# Patient Record
Sex: Male | Born: 1980 | Race: White | Hispanic: No | Marital: Married | Smoking: Former smoker
Health system: Southern US, Community
[De-identification: ages and names within clinical notes are randomized; demographics above are authoritative.]

## PROBLEM LIST (undated history)

## (undated) DIAGNOSIS — R569 Unspecified convulsions: Secondary | ICD-10-CM

## (undated) DIAGNOSIS — N289 Disorder of kidney and ureter, unspecified: Secondary | ICD-10-CM

## (undated) HISTORY — PX: HERNIA REPAIR: SHX51

---

## 2006-02-21 ENCOUNTER — Emergency Department (HOSPITAL_COMMUNITY): Admission: EM | Admit: 2006-02-21 | Discharge: 2006-02-21 | Payer: Self-pay | Admitting: Emergency Medicine

## 2006-02-24 ENCOUNTER — Emergency Department (HOSPITAL_COMMUNITY): Admission: EM | Admit: 2006-02-24 | Discharge: 2006-02-25 | Payer: Self-pay | Admitting: Emergency Medicine

## 2011-11-13 ENCOUNTER — Emergency Department (HOSPITAL_COMMUNITY)
Admission: EM | Admit: 2011-11-13 | Discharge: 2011-11-13 | Disposition: A | Discharge status: Home / Self Care | Payer: Self-pay | Attending: Emergency Medicine | Admitting: Emergency Medicine

## 2011-11-13 ENCOUNTER — Encounter (HOSPITAL_COMMUNITY): Payer: Self-pay | Admitting: *Deleted

## 2011-11-13 DIAGNOSIS — M542 Cervicalgia: Secondary | ICD-10-CM | POA: Insufficient documentation

## 2011-11-13 DIAGNOSIS — X503XXA Overexertion from repetitive movements, initial encounter: Secondary | ICD-10-CM | POA: Insufficient documentation

## 2011-11-13 DIAGNOSIS — Y9389 Activity, other specified: Secondary | ICD-10-CM | POA: Insufficient documentation

## 2011-11-13 DIAGNOSIS — G5600 Carpal tunnel syndrome, unspecified upper limb: Secondary | ICD-10-CM | POA: Insufficient documentation

## 2011-11-13 DIAGNOSIS — G40909 Epilepsy, unspecified, not intractable, without status epilepticus: Secondary | ICD-10-CM | POA: Insufficient documentation

## 2011-11-13 DIAGNOSIS — F17200 Nicotine dependence, unspecified, uncomplicated: Secondary | ICD-10-CM | POA: Insufficient documentation

## 2011-11-13 DIAGNOSIS — R209 Unspecified disturbances of skin sensation: Secondary | ICD-10-CM | POA: Insufficient documentation

## 2011-11-13 DIAGNOSIS — G5602 Carpal tunnel syndrome, left upper limb: Secondary | ICD-10-CM

## 2011-11-13 DIAGNOSIS — Y929 Unspecified place or not applicable: Secondary | ICD-10-CM | POA: Insufficient documentation

## 2011-11-13 HISTORY — DX: Unspecified convulsions: R56.9

## 2011-11-13 MED ORDER — HYDROCODONE-ACETAMINOPHEN 5-325 MG PO TABS: ORAL_TABLET | ORAL | Status: DC

## 2011-11-13 MED ORDER — OXYCODONE-ACETAMINOPHEN 5-325 MG PO TABS: 1.0000 | ORAL_TABLET | Freq: Once | ORAL | Status: DC

## 2011-11-13 NOTE — ED Notes (Signed)
Pt with left sided neck pain x 1 month that starts at base of head and radiates down left arm, tingling to left 4th and 5th fingers of left hand, has been seen at [facility] for same and prescribed muscle relaxers, still continues to have pain

## 2011-11-14 NOTE — ED Provider Notes (Signed)
Medical screening examination/treatment/procedure(s) were performed by non-physician practitioner and as supervising physician I was immediately available for consultation/collaboration.   Glynn Octave, MD 11/14/11 2136

## 2011-11-14 NOTE — ED Provider Notes (Signed)
History     CSN: [id number]  Arrival date & time 11/13/11  1636   First MD Initiated Contact with Patient 11/13/11 1825      Chief Complaint  Patient presents with  . Neck Pain    (Consider location/radiation/quality/duration/timing/severity/associated sxs/prior treatment) HPI Comments: Pt reports a 1 month hx of left neck pain. Most recently he has noted tingling of the left 4th and 5th fingers at the plantar area. No known hx of injury. 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 He was evaluated at [facility] and told to try muscle relaxers. Pt states this did not help. He nearly dropped a large piece pane of glass today and his boss told him to have this checked out.  Patient is a 31 y.o. male presenting with neck pain. The history is provided by the patient.  Neck Pain  The current episode started more than 1 week ago. The problem occurs daily. The problem has been gradually worsening. The pain is associated with lifting a heavy object. There has been no fever. The pain is present in the left side. The quality of the pain is described as aching. The pain radiates to the left arm and left hand. The pain is moderate. Exacerbated by: certain positions. The pain is the same all the time. Associated symptoms include numbness and tingling. Pertinent negatives include no photophobia, no chest pain, no syncope, no bowel incontinence and no bladder incontinence. He has tried muscle relaxants for the symptoms. The treatment provided no relief.    Past Medical History  Diagnosis Date  . Seizures     Past Surgical History  Procedure Date  . Hernia repair     History reviewed. No pertinent family history.  History  Substance Use Topics  . Smoking status: Current Every Day Smoker -- 1.0 packs/day    Types: Cigarettes  . Smokeless tobacco: Not on file  . Alcohol Use: Yes     Comment: occasional      Review of Systems  Constitutional: Negative for activity change.       All  ROS Neg except as noted in HPI  HENT: Positive for neck pain. Negative for nosebleeds.   Eyes: Negative for photophobia and discharge.  Respiratory: Negative for cough, shortness of breath and wheezing.   Cardiovascular: Negative for chest pain, palpitations and syncope.  Gastrointestinal: Negative for abdominal pain, blood in stool and bowel incontinence.  Genitourinary: Negative for bladder incontinence, dysuria, frequency and hematuria.  Musculoskeletal: Negative for back pain and arthralgias.  Skin: Negative.   Neurological: Positive for tingling and numbness. Negative for dizziness, seizures and speech difficulty.  Psychiatric/Behavioral: Negative for hallucinations and confusion.    Allergies  Sulfa antibiotics  Home Medications   Current Outpatient Rx  Name  Route  Sig  Dispense  Refill  . HYDROCODONE-ACETAMINOPHEN 5-325 MG PO TABS      1 at hs or q4h prn pain   15 tablet   0     BP 130/94  Pulse 93  Temp 97.7 F (36.5 C) (Oral)  Resp 20  Ht 5\' 5"  (1.651 m)  Wt 145 lb (65.772 kg)  BMI 24.13 kg/m2  SpO2 100%  Physical Exam  Nursing note and vitals reviewed. Constitutional: He is oriented to person, place, and time. He appears well-developed and well-nourished.  Non-toxic appearance.  HENT:  Head: Normocephalic.  Right Ear: Tympanic membrane and external ear normal.  Left Ear: Tympanic membrane and external ear normal.  Eyes: EOM and  lids are normal. Pupils are equal, round, and reactive to light.  Neck: Normal range of motion. Neck supple. Carotid bruit is not present.       Left neck pain to palpation. No carotid bruits appreciated. No pulsatile mass noted. Soreness of the neck with attempted range of motion.  Cardiovascular: Normal rate, regular rhythm, normal heart sounds, intact distal pulses and normal pulses.   Pulmonary/Chest: Breath sounds normal. No respiratory distress.  Abdominal: Soft. Bowel sounds are normal. There is no tenderness. There is no  guarding.  Musculoskeletal: Normal range of motion.       Is no noted muscle atrophy of the left upper extremity. The thenar eminence is symmetrical.  Lymphadenopathy:       Head (right side): No submandibular adenopathy present.       Head (left side): No submandibular adenopathy present.    He has no cervical adenopathy.  Neurological: He is alert and oriented to person, place, and time. He has normal strength. No cranial nerve deficit or sensory deficit.       Grip of the right and left hand is symmetrical. Patient states that it takes more effort for him to grip on the left hand than on the right.  Skin: Skin is warm and dry.  Psychiatric: He has a normal mood and affect. His speech is normal.    ED Course  Procedures (including critical care time)  Labs Reviewed - No data to display No results found.   1. Neck pain   2. Carpal tunnel syndrome of left wrist       MDM  I have reviewed nursing notes, vital signs, and all appropriate lab and imaging results for this patient. Patient has one month history of neck pain. Patient has numbness and tingling of the left fourth and fifth fingers they get progressively worse as the day goes on. Suspect the patient has a carpal tunnel syndrome on the left. Question if the patient may have degenerative changes of the cervical spine. Patient has a questionable Finkelstein test. Patient placed in a wrist cockup splint and advised to see orthopedics. Patient states he still has some steroid, patient advised to continue this medication. Prescription for Norco a #15 tablets also given to the patient.       Kathie Dike, [place] 11/14/11 [phone]

## 2012-09-14 ENCOUNTER — Emergency Department (HOSPITAL_COMMUNITY)
Admission: EM | Admit: 2012-09-14 | Discharge: 2012-09-14 | Disposition: A | Discharge status: Home / Self Care | Payer: Self-pay | Attending: Emergency Medicine | Admitting: Emergency Medicine

## 2012-09-14 ENCOUNTER — Emergency Department (HOSPITAL_COMMUNITY): Payer: Self-pay

## 2012-09-14 ENCOUNTER — Encounter (HOSPITAL_COMMUNITY): Payer: Self-pay | Admitting: *Deleted

## 2012-09-14 DIAGNOSIS — R109 Unspecified abdominal pain: Secondary | ICD-10-CM | POA: Insufficient documentation

## 2012-09-14 DIAGNOSIS — M545 Low back pain, unspecified: Secondary | ICD-10-CM | POA: Insufficient documentation

## 2012-09-14 DIAGNOSIS — Z87448 Personal history of other diseases of urinary system: Secondary | ICD-10-CM | POA: Insufficient documentation

## 2012-09-14 DIAGNOSIS — R112 Nausea with vomiting, unspecified: Secondary | ICD-10-CM | POA: Insufficient documentation

## 2012-09-14 DIAGNOSIS — F17200 Nicotine dependence, unspecified, uncomplicated: Secondary | ICD-10-CM | POA: Insufficient documentation

## 2012-09-14 DIAGNOSIS — R509 Fever, unspecified: Secondary | ICD-10-CM | POA: Insufficient documentation

## 2012-09-14 DIAGNOSIS — Z8669 Personal history of other diseases of the nervous system and sense organs: Secondary | ICD-10-CM | POA: Insufficient documentation

## 2012-09-14 DIAGNOSIS — Z880 Allergy status to penicillin: Secondary | ICD-10-CM | POA: Insufficient documentation

## 2012-09-14 DIAGNOSIS — R319 Hematuria, unspecified: Secondary | ICD-10-CM | POA: Insufficient documentation

## 2012-09-14 DIAGNOSIS — M549 Dorsalgia, unspecified: Secondary | ICD-10-CM

## 2012-09-14 HISTORY — DX: Disorder of kidney and ureter, unspecified: N28.9

## 2012-09-14 LAB — URINALYSIS, ROUTINE W REFLEX MICROSCOPIC
Bilirubin Urine: NEGATIVE
Hgb urine dipstick: NEGATIVE
Nitrite: NEGATIVE
Specific Gravity, Urine: >1.03 — ABNORMAL HIGH (ref 1.005–1.030)
pH: 5.5 (ref 5.0–8.0)

## 2012-09-14 LAB — BASIC METABOLIC PANEL
BUN: 8 mg/dL (ref 6–23)
Chloride: 102 mEq/L (ref 96–112)
GFR calc Af Amer: >90 mL/min (ref >90)
Glucose, Bld: 119 mg/dL — ABNORMAL HIGH (ref 70–99)
Potassium: 4.1 mEq/L (ref 3.5–5.1)

## 2012-09-14 LAB — CBC WITH DIFFERENTIAL/PLATELET
Hemoglobin: 16.4 g/dL (ref 13.0–17.0)
Lymphocytes Relative: 25 % (ref 12–46)
Lymphs Abs: 2.2 10*3/uL (ref 0.7–4.0)
Monocytes Relative: 10 % (ref 3–12)
Neutro Abs: 5.5 10*3/uL (ref 1.7–7.7)
Neutrophils Relative %: 63 % (ref 43–77)
RBC: 5.36 MIL/uL (ref 4.22–5.81)

## 2012-09-14 MED ORDER — SODIUM CHLORIDE 0.9 % IV BOLUS (SEPSIS)
1000.0000 mL | Freq: Once | INTRAVENOUS | Status: AC
  Administered 2012-09-14: 1000 mL via INTRAVENOUS

## 2012-09-14 NOTE — ED Provider Notes (Signed)
CSN: [id number]     Arrival date & time 09/14/12  1746 History  This chart was scribed for Audree Camel, MD by Henri Medal, ED Scribe. This patient was seen in room APA11/APA11 and the patient's care was started at 7:31 PM.    Chief Complaint  Patient presents with  . Back Pain    The history is provided by the patient. No language interpreter was used.   HPI Comments: Jeremy Dunn is a 32 y.o. male who presents to the Emergency Department complaining of constant 8/10 left flank pain that radiates to his lower back and LLQ abdomen onset 5 days ago. Pt was seen in the [place] ED 5 days ago and was given a CT scan and diagnosed with a lodged kidney stone and left kidney infection, that was believed to be spreading to the right kidney. He states that he has had no prior kidney stones, but states that he has a family history of kidney stones (father and brother). Pt states he was given antibiotics and Norco and took them for 2 days without relief. Pt states movement and urinating worsens his flank pain to a 10/10 severity. He reports associated emesis and hematuria for the first 2 days after the onset of pain which has subsided. He reports associated chills over the past 5 days. Pt has a history of a double hernia. Pt denies weakness, numbness, saddle anaesthesia, fever, dysuria or any other symptoms.   Past Medical History  Diagnosis Date  . Seizures   . Renal disorder     renal stones   Past Surgical History  Procedure Laterality Date  . Hernia repair     History reviewed. No pertinent family history. History  Substance Use Topics  . Smoking status: Current Every Day Smoker -- 1.00 packs/day    Types: Cigarettes  . Smokeless tobacco: Not on file  . Alcohol Use: Yes     Comment: occasional    Review of Systems  Constitutional: Positive for chills.  Gastrointestinal: Positive for nausea, vomiting (subsided) and abdominal pain.  Genitourinary: Positive for hematuria  (subsided) and flank pain.  Musculoskeletal: Positive for back pain.  All other systems reviewed and are negative.   Allergies  Amoxicillin and Sulfa antibiotics  Home Medications   Current Outpatient Rx  Name  Route  Sig  Dispense  Refill  . Aspirin-Acetaminophen-Caffeine (GOODYS EXTRA STRENGTH) 500-325-65 MG PACK   Oral   Take 1 packet by mouth every 6 (six) hours as needed. pain         . HYDROcodone-acetaminophen (NORCO/VICODIN) 5-325 MG per tablet   Oral   Take 1 tablet by mouth every 4 (four) hours as needed for pain.          Triage Vitals: BP 122/74  Pulse 97  Temp(Src) 98.5 F (36.9 C) (Oral)  Resp 17  Ht 5\' 6"  (1.676 m)  Wt 145 lb (65.772 kg)  BMI 23.41 kg/m2  SpO2 98%  Physical Exam  Nursing note and vitals reviewed. Constitutional: He is oriented to person, place, and time. He appears well-developed and well-nourished. No distress.  HENT:  Head: Normocephalic and atraumatic.  Mouth/Throat: Oropharynx is clear and moist.  Eyes: EOM are normal.  Neck: Neck supple. No tracheal deviation present.  Cardiovascular: Normal rate, regular rhythm and normal heart sounds.   Pulmonary/Chest: Effort normal and breath sounds normal. No respiratory distress.  Abdominal: Soft. Bowel sounds are normal. There is tenderness.  LLQ abdominal tenderness  Genitourinary:  Left  CVA tenderness  Musculoskeletal: Normal range of motion.       Thoracic back: He exhibits no bony tenderness.       Lumbar back: He exhibits no bony tenderness.  Neurological: He is alert and oriented to person, place, and time. He has normal strength. No sensory deficit.  5 out of 5 strength in the lower extremities bilaterally  Skin: Skin is warm and dry.  Psychiatric: He has a normal mood and affect. His behavior is normal.    ED Course  Procedures (including critical care time)  DIAGNOSTIC STUDIES: Oxygen Saturation is 98% on room air, normal by my interpretation.    COORDINATION OF  CARE: 7:40 PM-Discussed treatment plan which includes CT scan, CBC, and UA. Pt was also advised of plan to receive IV fluids. Pt understands and agrees to treatment plan.  Medications  sodium chloride 0.9 % bolus 1,000 mL (1,000 mLs Intravenous New Bag/Given 09/14/12 2009)   Labs Review Labs Reviewed  BASIC METABOLIC PANEL - Abnormal; Notable for the following:    Glucose, Bld 119 (*)    All other components within normal limits  URINALYSIS, ROUTINE W REFLEX MICROSCOPIC - Abnormal; Notable for the following:    Specific Gravity, Urine >1.030 (*)    All other components within normal limits  URINE CULTURE  CBC WITH DIFFERENTIAL   Imaging Review Ct Abdomen Pelvis Wo Contrast  09/14/2012   CLINICAL DATA:  Left flank pain.  EXAM: CT ABDOMEN AND PELVIS WITHOUT CONTRAST  TECHNIQUE: Multidetector CT imaging of the abdomen and pelvis was performed following the standard protocol without intravenous contrast.  COMPARISON:  None.  FINDINGS: Lung bases are clear. No effusions. Heart is normal size.  Liver, spleen, pancreas, adrenals and the left kidney have an unremarkable unenhanced appearance. 4 mm nonobstructing stone in the lower pole of the right kidney. No ureteral stones. No hydronephrosis. Urinary bladder is decompressed. Seminal vesicles and prostate grossly unremarkable.  Appendix is visualized and is normal. Bowel grossly unremarkable. No free fluid, free air, or adenopathy. Aorta is normal caliber.  No acute bony abnormality.  IMPRESSION: No ureteral stones or hydronephrosis. Right lower pole nephrolithiasis.   Electronically Signed   By: Charlett Nose M.D.   On: 09/14/2012 20:35    MDM   1. Back pain    Patient appears well and denies wanting any pain meds at this time, just wants to know why his back hurts. It is most c/w a MSK etiology as his sx are all exacerbated and relieved with ROM and positions. After CT and getting OSH records it is apparent he has not had any kidney stones on the  left but did have a UTI which he is almost done w/ abx for. As his kidney function is good and has benign exam I feel he is stable for outpatient f/u (already has PCP set up) and OTC treatment of pain. No midline pain or fevers to suggest more emergent back pathology. Has normal neuro exam. Discussed return precautions.  I personally performed the services described in this documentation, which was scribed in my presence. The recorded information has been reviewed and is accurate.    Audree Camel, MD 09/15/12 [phone]

## 2012-09-14 NOTE — ED Notes (Signed)
Patient asked for a drink of water. Asked nurse RN Dorann Ou, mad patient aware that his CT scans have to be resulted first.

## 2012-09-14 NOTE — Discharge Instructions (Signed)
Back Pain, Adult  Low back pain is very common. About 1 in 5 people have back pain. The cause of low back pain is rarely dangerous. The pain often gets better over time. About half of people with a sudden onset of back pain feel better in just 2 weeks. About 8 in 10 people feel better by 6 weeks.   CAUSES  Some common causes of back pain include:  · Strain of the muscles or ligaments supporting the spine.  · Wear and tear (degeneration) of the spinal discs.  · Arthritis.  · Direct injury to the back.  DIAGNOSIS  Most of the time, the direct cause of low back pain is not known. However, back pain can be treated effectively even when the exact cause of the pain is unknown. Answering your caregiver's questions about your overall health and symptoms is one of the most accurate ways to make sure the cause of your pain is not dangerous. If your caregiver needs more information, he or she may order lab work or imaging tests (X-rays or MRIs). However, even if imaging tests show changes in your back, this usually does not require surgery.  HOME CARE INSTRUCTIONS  For many people, back pain returns. Since low back pain is rarely dangerous, it is often a condition that people can learn to manage on their own.   · Remain active. It is stressful on the back to sit or stand in one place. Do not sit, drive, or stand in one place for more than 30 minutes at a time. Take short walks on level surfaces as soon as pain allows. Try to increase the length of time you walk each day.  · Do not stay in bed. Resting more than 1 or 2 days can delay your recovery.  · Do not avoid exercise or work. Your body is made to move. It is not dangerous to be active, even though your back may hurt. Your back will likely heal faster if you return to being active before your pain is gone.  · Pay attention to your body when you  bend and lift. Many people have less discomfort when lifting if they bend their knees, keep the load close to their bodies, and  avoid twisting. Often, the most comfortable positions are those that put less stress on your recovering back.  · Find a comfortable position to sleep. Use a firm mattress and lie on your side with your knees slightly bent. If you lie on your back, put a pillow under your knees.  · Only take over-the-counter or prescription medicines as directed by your caregiver. Over-the-counter medicines to reduce pain and inflammation are often the most helpful. Your caregiver may prescribe muscle relaxant drugs. These medicines help dull your pain so you can more quickly return to your normal activities and healthy exercise.  · Put ice on the injured area.  · Put ice in a plastic bag.  · Place a towel between your skin and the bag.  · Leave the ice on for 15-20 minutes, 3-4 times a day for the first 2 to 3 days. After that, ice and heat may be alternated to reduce pain and spasms.  · Ask your caregiver about trying back exercises and gentle massage. This may be of some benefit.  · Avoid feeling anxious or stressed. Stress increases muscle tension and can worsen back pain. It is important to recognize when you are anxious or stressed and learn ways to manage it. Exercise is a great option.  SEEK MEDICAL CARE IF:  · You have pain that is not relieved with rest or   medicine.  · You have pain that does not improve in 1 week.  · You have new symptoms.  · You are generally not feeling well.  SEEK IMMEDIATE MEDICAL CARE IF:   · You have pain that radiates from your back into your legs.  · You develop new bowel or bladder control problems.  · You have unusual weakness or numbness in your arms or legs.  · You develop nausea or vomiting.  · You develop abdominal pain.  · You feel faint.  Document Released: 12/19/2004 Document Revised: 06/20/2011 Document Reviewed: 05/09/2010  ExitCare® Patient Information ©2014 [employer].

## 2012-09-14 NOTE — ED Notes (Addendum)
L flank pain radiating to mid back and hip.  Was seen at [facility] last Tuesday for same, given pain medication which hasn't helped. Was scanned which showed lodged renal stone

## 2012-09-16 LAB — URINE CULTURE
Colony Count: NO GROWTH
Culture: NO GROWTH

## 2016-03-09 ENCOUNTER — Ambulatory Visit (INDEPENDENT_AMBULATORY_CARE_PROVIDER_SITE_OTHER): Payer: Medicaid Other | Admitting: Family

## 2016-03-09 ENCOUNTER — Ambulatory Visit (INDEPENDENT_AMBULATORY_CARE_PROVIDER_SITE_OTHER): Payer: Medicaid Other

## 2016-03-09 ENCOUNTER — Encounter: Payer: Self-pay | Admitting: Family

## 2016-03-09 VITALS — BP 127/83 | HR 72 | Temp 98.0°F | Ht 66.0 in | Wt 182.2 lb

## 2016-03-09 DIAGNOSIS — M79644 Pain in right finger(s): Secondary | ICD-10-CM

## 2016-03-09 DIAGNOSIS — S60041A Contusion of right ring finger without damage to nail, initial encounter: Secondary | ICD-10-CM | POA: Diagnosis not present

## 2016-03-09 MED ORDER — NAPROXEN 500 MG PO TABS: 500.0000 mg | ORAL_TABLET | Freq: Two times a day (BID) | ORAL | 0 refills | Status: DC

## 2016-03-09 NOTE — Patient Instructions (Signed)
Contusion A contusion is a deep bruise. Contusions are the result of a blunt injury to tissues and muscle fibers under the skin. The injury causes bleeding under the skin. The skin overlying the contusion may turn blue, purple, or yellow. Minor injuries will give you a painless contusion, but more severe contusions may stay painful and swollen for a few weeks. What are the causes? This condition is usually caused by a blow, trauma, or direct force to an area of the body. What are the signs or symptoms? Symptoms of this condition include:  Swelling of the injured area.  Pain and tenderness in the injured area.  Discoloration. The area may have redness and then turn blue, purple, or yellow. How is this diagnosed? This condition is diagnosed based on a physical exam and medical history. An X-ray, CT scan, or MRI may be needed to determine if there are any associated injuries, such as broken bones (fractures). How is this treated? Specific treatment for this condition depends on what area of the body was injured. In general, the best treatment for a contusion is resting, icing, applying pressure to (compression), and elevating the injured area. This is often called the RICE strategy. Over-the-counter anti-inflammatory medicines may also be recommended for pain control. Follow these instructions at home:  Rest the injured area.  If directed, apply ice to the injured area:  Put ice in a plastic bag.  Place a towel between your skin and the bag.  Leave the ice on for 20 minutes, 2-3 times per day.  If directed, apply light compression to the injured area using an elastic bandage. Make sure the bandage is not wrapped too tightly. Remove and reapply the bandage as directed by your health care provider.  If possible, raise (elevate) the injured area above the level of your heart while you are sitting or lying down.  Take over-the-counter and prescription medicines only as told by your health  care provider. Contact a health care provider if:  Your symptoms do not improve after several days of treatment.  Your symptoms get worse.  You have difficulty moving the injured area. Get help right away if:  You have severe pain.  You have numbness in a hand or foot.  Your hand or foot turns pale or cold. This information is not intended to replace advice given to you by your health care provider. Make sure you discuss any questions you have with your health care provider. Document Released: 09/28/2004 Document Revised: 04/29/2015 Document Reviewed: 05/06/2014 Elsevier Interactive Patient Education  2017 [employer] Inc.  

## 2016-03-09 NOTE — Progress Notes (Signed)
   Subjective:    Patient ID: Jeremy Dunn, male    DOB: Dec 27, 1980, 36 y.o.   MRN: [id number]  Hand Pain   The incident occurred more than 1 week ago. The injury mechanism was a direct blow. Pain location: right ring finger. The quality of the pain is described as aching. The pain does not radiate. The pain is at a severity of 8/10. The pain is moderate. The pain has been constant since the incident. The symptoms are aggravated by movement. He has tried NSAIDs and ice for the symptoms. The treatment provided mild relief.      Review of Systems  Musculoskeletal:       Right finger   All other systems reviewed and are negative.  Social History   Social History  . Marital status: Single    Spouse name: N/A  . Number of children: N/A  . Years of education: N/A   Social History Main Topics  . Smoking status: Former Smoker    Packs/day: 1.00    Types: Cigarettes  . Smokeless tobacco: Never Used  . Alcohol use Yes     Comment: occasional  . Drug use: No  . Sexual activity: Yes   Other Topics Concern  . None   Social History Narrative  . None   Family History  Problem Relation Age of Onset  . Hypertension Mother   . Cancer Father        Objective:   Physical Exam  Constitutional: He is oriented to person, place, and time. He appears well-developed and well-nourished. No distress.  HENT:  Head: Normocephalic.  Eyes: Pupils are equal, round, and reactive to light. Right eye exhibits no discharge. Left eye exhibits no discharge.  Neck: Normal range of motion. Neck supple. No thyromegaly present.  Cardiovascular: Normal rate, regular rhythm, normal heart sounds and intact distal pulses.   No murmur heard. Pulmonary/Chest: Effort normal and breath sounds normal. No respiratory distress. He has no wheezes.  Abdominal: Soft. Bowel sounds are normal. He exhibits no distension. There is no tenderness.  Musculoskeletal: Normal range of motion. He exhibits edema and  tenderness.  Decrease ROM of right ring finger with flexion related to pain   Neurological: He is alert and oriented to person, place, and time.  Skin: Skin is warm and dry. No rash noted. No erythema.  Psychiatric: He has a normal mood and affect. His behavior is normal. Judgment and thought content normal.  Vitals reviewed.   X-ray finger- No fracture noted Preliminary reading by Jannifer Rodneyhristy Hawks, FNP [facility]   BP 127/83   Pulse 72   Temp 98 F (36.7 C) (Oral)   Ht 5\' 6"  (1.676 m)   Wt 182 lb 3.2 oz (82.6 kg)   BMI 29.41 kg/m      Assessment & Plan:  1. Finger pain, right - DG Finger Ring Right; Future - naproxen (NAPROSYN) 500 MG tablet; Take 1 tablet (500 mg total) by mouth 2 (two) times daily with a meal.  Dispense: 60 tablet; Refill: 0  2. Contusion of right ring finger without damage to nail, initial encounter -Rest -Ice -Can Buddy tape if helps with pain RTO prn  - naproxen (NAPROSYN) 500 MG tablet; Take 1 tablet (500 mg total) by mouth 2 (two) times daily with a meal.  Dispense: 60 tablet; Refill: 0  Jannifer Rodneyhristy Hawks, FNP

## 2016-06-22 ENCOUNTER — Ambulatory Visit (INDEPENDENT_AMBULATORY_CARE_PROVIDER_SITE_OTHER): Payer: Medicaid Other | Admitting: Family

## 2016-06-22 ENCOUNTER — Encounter: Payer: Self-pay | Admitting: Family

## 2016-06-22 VITALS — BP 120/68 | HR 71 | Temp 97.6°F | Ht 66.0 in | Wt 183.0 lb

## 2016-06-22 DIAGNOSIS — S80262A Insect bite (nonvenomous), left knee, initial encounter: Secondary | ICD-10-CM

## 2016-06-22 DIAGNOSIS — W57XXXA Bitten or stung by nonvenomous insect and other nonvenomous arthropods, initial encounter: Secondary | ICD-10-CM

## 2016-06-22 DIAGNOSIS — R350 Frequency of micturition: Secondary | ICD-10-CM | POA: Diagnosis not present

## 2016-06-22 LAB — URINALYSIS, COMPLETE
BILIRUBIN UA: NEGATIVE
GLUCOSE, UA: NEGATIVE
KETONES UA: NEGATIVE
Leukocytes, UA: NEGATIVE
NITRITE UA: NEGATIVE
Protein, UA: NEGATIVE
RBC UA: NEGATIVE
Specific Gravity, UA: 1.01 (ref 1.005–1.030)
UUROB: 0.2 mg/dL (ref 0.2–1.0)
pH, UA: 7 (ref 5.0–7.5)

## 2016-06-22 LAB — MICROSCOPIC EXAMINATION
BACTERIA UA: NONE SEEN
Epithelial Cells (non renal): NONE SEEN /hpf (ref 0–10)
RBC MICROSCOPIC, UA: NONE SEEN /HPF (ref 0–?)
Renal Epithel, UA: NONE SEEN /hpf
WBC UA: NONE SEEN /HPF (ref 0–?)

## 2016-06-22 MED ORDER — CEPHALEXIN 500 MG PO CAPS: 500.0000 mg | ORAL_CAPSULE | Freq: Three times a day (TID) | ORAL | 0 refills | Status: DC

## 2016-06-22 NOTE — Progress Notes (Signed)
   Subjective:    Patient ID: Jeremy Dunn, male    DOB: Feb 27, 1980, 36 y.o.   MRN: [id number]  Urinary Frequency   This is a new problem. The current episode started more than 1 month ago. The problem occurs every urination. The problem has been gradually worsening. The pain is at a severity of 0/10. The patient is experiencing no pain. There has been no fever. Associated symptoms include flank pain, frequency, hesitancy and urgency. Pertinent negatives include no discharge, hematuria, nausea or vomiting. He has tried increased fluids for the symptoms.  Rash  This is a new problem. The current episode started in the past 7 days. The problem has been gradually worsening since onset. Location: posterior left knee. The rash is characterized by itchiness, dryness and redness. Pertinent negatives include no vomiting. Past treatments include anti-itch cream. The treatment provided mild relief.      Review of Systems  Gastrointestinal: Negative for nausea and vomiting.  Genitourinary: Positive for flank pain, frequency, hesitancy and urgency. Negative for hematuria.  Skin: Positive for rash.  All other systems reviewed and are negative.      Objective:   Physical Exam  Constitutional: He is oriented to person, place, and time. He appears well-developed and well-nourished. No distress.  HENT:  Head: Normocephalic.  Right Ear: External ear normal.  Left Ear: External ear normal.  Nose: Nose normal.  Mouth/Throat: Oropharynx is clear and moist.  Eyes: Pupils are equal, round, and reactive to light. Right eye exhibits no discharge. Left eye exhibits no discharge.  Neck: Normal range of motion. Neck supple. No thyromegaly present.  Cardiovascular: Normal rate, regular rhythm, normal heart sounds and intact distal pulses.   No murmur heard. Pulmonary/Chest: Effort normal and breath sounds normal. No respiratory distress. He has no wheezes.  Abdominal: Soft. Bowel sounds are normal. He  exhibits no distension. There is no tenderness.  Musculoskeletal: Normal range of motion. He exhibits no edema or tenderness.  Neurological: He is alert and oriented to person, place, and time.  Skin: Skin is warm and dry. Rash noted. There is erythema.  Erythemas circular rash on posterior left knee, warmth and tenderness present  Psychiatric: He has a normal mood and affect. His behavior is normal. Judgment and thought content normal.  Vitals reviewed.    BP 120/68   Pulse 71   Temp 97.6 F (36.4 C) (Oral)   Ht 5\' 6"  (1.676 m)   Wt 183 lb (83 kg)   BMI 29.54 kg/m      Assessment & Plan:  1. Urine frequency -Avoid caffeine and alcohol  - Urinalysis, Complete Avoid constipation  If these symptoms continue may need to try medication  2. Insect bite, initial encounter Do not scratch Report any increase in erythemas or fevers RTO if it does not improve or worsens - cephALEXin (KEFLEX) 500 MG capsule; Take 1 capsule (500 mg total) by mouth 3 (three) times daily.  Dispense: 30 capsule; Refill: 0   Jannifer Rodneyhristy Hannia Matchett, FNP

## 2016-06-22 NOTE — Patient Instructions (Addendum)

## 2016-06-23 LAB — URINE CULTURE

## 2016-06-27 ENCOUNTER — Ambulatory Visit (INDEPENDENT_AMBULATORY_CARE_PROVIDER_SITE_OTHER): Payer: Medicaid Other | Admitting: Family Medicine

## 2016-06-27 ENCOUNTER — Encounter: Payer: Self-pay | Admitting: Family Medicine

## 2016-06-27 ENCOUNTER — Ambulatory Visit: Payer: Medicaid Other

## 2016-06-27 VITALS — BP 133/81 | HR 74 | Temp 98.5°F | Ht 66.0 in | Wt 184.0 lb

## 2016-06-27 DIAGNOSIS — K2100 Gastro-esophageal reflux disease with esophagitis, without bleeding: Secondary | ICD-10-CM

## 2016-06-27 MED ORDER — RANITIDINE HCL 150 MG PO TABS: 150.0000 mg | ORAL_TABLET | Freq: Two times a day (BID) | ORAL | 5 refills | Status: DC

## 2016-06-27 NOTE — Progress Notes (Signed)
BP 133/81   Pulse 74   Temp 98.5 F (36.9 C) (Oral)   Ht 5\' 6"  (1.676 m)   Wt 184 lb (83.5 kg)   BMI 29.70 kg/m    Subjective:    Patient ID: Jeremy Dunn, male    DOB: 10/02/1980, 36 y.o.   MRN: [id number]  HPI: Jeremy Dunn is a 36 y.o. male presenting on 06/27/2016 for Pain in chest, worse when lifting or moving   HPI Chest pain Patient has been having chest pain in the center of his chest that is worse with lifting or moving things he says the pain feels like a burning sensation that is worse when he stretches his arms out. He also complains of upper abdominal discomfort and belching and burping that is all been increased since yesterday. He says it was especially worse last evening when he was lying down watching something on the television. He has not had recurrent issues with gas and reflux. He denies any fevers or chills or nausea or vomiting. He denies any blood in his stool or diarrhea or constipation. He denies any shortness of breath or lightheadedness or dizziness.  Relevant past medical, surgical, family and social history reviewed and updated as indicated. Interim medical history since our last visit reviewed. Allergies and medications reviewed and updated.  Review of Systems  Constitutional: Negative for chills and fever.  Eyes: Negative for discharge.  Respiratory: Negative for shortness of breath and wheezing.   Cardiovascular: Positive for chest pain. Negative for leg swelling.  Gastrointestinal: Positive for abdominal pain. Negative for constipation, diarrhea, nausea and vomiting.  Genitourinary: Negative for difficulty urinating.  Musculoskeletal: Negative for back pain and gait problem.  Skin: Negative for rash.  Neurological: Negative for dizziness.  All other systems reviewed and are negative.   Per HPI unless specifically indicated above        Objective:    BP 133/81   Pulse 74   Temp 98.5 F (36.9 C) (Oral)   Ht 5\' 6"  (1.676 m)   Wt  184 lb (83.5 kg)   BMI 29.70 kg/m   Wt Readings from Last 3 Encounters:  06/27/16 184 lb (83.5 kg)  06/22/16 183 lb (83 kg)  03/09/16 182 lb 3.2 oz (82.6 kg)    Physical Exam  Constitutional: He is oriented to person, place, and time. He appears well-developed and well-nourished. No distress.  Eyes: Conjunctivae are normal. No scleral icterus.  Cardiovascular: Normal rate, regular rhythm, normal heart sounds and intact distal pulses.   No murmur heard. Pulmonary/Chest: Effort normal and breath sounds normal. No respiratory distress. He has no wheezes. He exhibits tenderness (Reproducible sternal chest wall tenderness).  Abdominal: Soft. Bowel sounds are normal. He exhibits no distension. There is tenderness (Epigastric abdominal discomfort). There is no rebound and no guarding.  Musculoskeletal: Normal range of motion. He exhibits no edema.  Neurological: He is alert and oriented to person, place, and time. Coordination normal.  Skin: Skin is warm and dry. No rash noted. He is not diaphoretic.  Psychiatric: He has a normal mood and affect. His behavior is normal.  Nursing note and vitals reviewed.       Assessment & Plan:   Problem List Items Addressed This Visit    None    Visit Diagnoses    Gastroesophageal reflux disease with esophagitis    -  Primary   Likely from antibiotic that he is taking, recommended grief yogurt and Zantac   Relevant Medications  ranitidine (ZANTAC) 150 MG tablet       Follow up plan: Return if symptoms worsen or fail to improve.  Counseling provided for all of the vaccine components No orders of the defined types were placed in this encounter.   Arville Care, MD [facility] 06/27/2016, 4:53 PM

## 2016-06-28 ENCOUNTER — Ambulatory Visit: Payer: Medicaid Other

## 2016-08-10 ENCOUNTER — Ambulatory Visit (INDEPENDENT_AMBULATORY_CARE_PROVIDER_SITE_OTHER): Payer: Medicaid Other | Admitting: Pediatrics

## 2016-08-10 ENCOUNTER — Encounter: Payer: Self-pay | Admitting: Pediatrics

## 2016-08-10 ENCOUNTER — Ambulatory Visit (INDEPENDENT_AMBULATORY_CARE_PROVIDER_SITE_OTHER): Payer: Medicaid Other

## 2016-08-10 VITALS — BP 137/88 | HR 84 | Temp 97.7°F | Ht 66.0 in | Wt 183.6 lb

## 2016-08-10 DIAGNOSIS — R1032 Left lower quadrant pain: Secondary | ICD-10-CM

## 2016-08-10 DIAGNOSIS — R309 Painful micturition, unspecified: Secondary | ICD-10-CM | POA: Diagnosis not present

## 2016-08-10 DIAGNOSIS — N3090 Cystitis, unspecified without hematuria: Secondary | ICD-10-CM | POA: Diagnosis not present

## 2016-08-10 LAB — MICROSCOPIC EXAMINATION: Renal Epithel, UA: NONE SEEN /hpf

## 2016-08-10 LAB — URINALYSIS, COMPLETE
BILIRUBIN UA: NEGATIVE
Glucose, UA: NEGATIVE
KETONES UA: NEGATIVE
NITRITE UA: NEGATIVE
Protein, UA: NEGATIVE
SPEC GRAV UA: 1.02 (ref 1.005–1.030)
Urobilinogen, Ur: 0.2 mg/dL (ref 0.2–1.0)
pH, UA: 5 (ref 5.0–7.5)

## 2016-08-10 MED ORDER — CIPROFLOXACIN HCL 500 MG PO TABS: 500.0000 mg | ORAL_TABLET | Freq: Two times a day (BID) | ORAL | 0 refills | Status: AC

## 2016-08-10 NOTE — Progress Notes (Signed)
  Subjective:   Patient ID: Jeremy Dunn, male    DOB: Nov 06, 1980, 36 y.o.   MRN: [id number] CC: Back Pain (Lower, left); Abdominal Pain (lower, left); and painful urination  HPI: Jeremy Dunn is a 36 y.o. male presenting for Back Pain (Lower, left); Abdominal Pain (lower, left); and painful urination  Back has been hurting last few days Sharp sting when he urinates No fevers No penile discharge Here today with partner and 5 kids Appetite has been fine No nausea/vomiting  Has h/o kidney stones, last over a year ago  No new sexual partners  Relevant past medical, surgical, family and social history reviewed. Allergies and medications reviewed and updated. History  Smoking Status  . Former Smoker  . Packs/day: 1.00  . Types: Cigarettes  Smokeless Tobacco  . Never Used   ROS: Per HPI   Objective:    BP 137/88   Pulse 84   Temp 97.7 F (36.5 C) (Oral)   Ht 5\' 6"  (1.676 m)   Wt 183 lb 9.6 oz (83.3 kg)   BMI 29.63 kg/m   Wt Readings from Last 3 Encounters:  08/10/16 183 lb 9.6 oz (83.3 kg)  06/27/16 184 lb (83.5 kg)  06/22/16 183 lb (83 kg)    Gen: NAD, alert, cooperative with exam, NCAT EYES: EOMI, no conjunctival injection, or no icterus ENT:   OP without erythema LYMPH: no cervical LAD CV: NRRR, normal S1/S2, no murmur, distal pulses 2+ b/l Resp: CTABL, no wheezes, normal WOB Abd: +BS, soft, mildly tender with palpation lower abd, ND. no guarding or organomegaly. No CVA tenderness Ext: No edema, warm Neuro: Alert and oriented MSK: normal muscle bulk  Assessment & Plan:  [name] was seen today for back pain, abdominal pain and painful urination.  Diagnoses and all orders for this visit:  Painful urination UA +blood, LEU Will treat for cystitis, if not improving needs ct to eval stone -     Urinalysis, Complete  Left inguinal pain Normal xray -     DG Abd 1 View; Future  Cystitis -     ciprofloxacin (CIPRO) 500 MG tablet; Take 1 tablet (500 mg  total) by mouth 2 (two) times daily.  Other orders -     Microscopic Examination   Follow up plan: As needed Rex Krasarol Vincent, MD [facility]

## 2016-08-21 ENCOUNTER — Encounter: Payer: Self-pay | Admitting: Nurse Practitioner

## 2016-08-21 ENCOUNTER — Ambulatory Visit (INDEPENDENT_AMBULATORY_CARE_PROVIDER_SITE_OTHER): Payer: Medicaid Other | Admitting: Nurse Practitioner

## 2016-08-21 VITALS — BP 120/89 | HR 74 | Temp 96.9°F | Ht 66.0 in | Wt 184.0 lb

## 2016-08-21 DIAGNOSIS — K640 First degree hemorrhoids: Secondary | ICD-10-CM

## 2016-08-21 MED ORDER — HYDROCORTISONE ACETATE 25 MG RE SUPP: 25.0000 mg | Freq: Two times a day (BID) | RECTAL | 0 refills | Status: DC

## 2016-08-21 NOTE — Progress Notes (Signed)
   Subjective:    Patient ID: Jeremy Dunn, male    DOB: September 08, 1980, 36 y.o.   MRN: [id number]  HPI Patient comes in today stating that there is something sticking out of his rectum. Bleeding and hurts to sit down. He has used OTC hemorrhoid cream which as not helped.    Review of Systems  Constitutional: Negative.   HENT: Negative.   Respiratory: Negative.   Cardiovascular: Negative.   Gastrointestinal: Positive for constipation (occasional).  Genitourinary: Negative.   Neurological: Negative.   Psychiatric/Behavioral: Negative.   All other systems reviewed and are negative.      Objective:   Physical Exam  Constitutional: He is oriented to person, place, and time. He appears well-developed and well-nourished. No distress.  Cardiovascular: Normal rate and regular rhythm.   Pulmonary/Chest: Effort normal and breath sounds normal.  Genitourinary:  Genitourinary Comments: Large external thrombosed hemorrhoid- tender to touch  Neurological: He is alert and oriented to person, place, and time.  Skin: Skin is warm.  Psychiatric: He has a normal mood and affect. His behavior is normal. Judgment and thought content normal.   BP 120/89   Pulse 74   Temp (!) 96.9 F (36.1 C) (Oral)   Ht 5\' 6"  (1.676 m)   Wt 184 lb (83.5 kg)   BMI 29.70 kg/m       Assessment & Plan:   1. Grade I hemorrhoids    Meds ordered this encounter  Medications  . hydrocortisone (ANUSOL-HC) 25 MG suppository    Sig: Place 1 suppository (25 mg total) rectally 2 (two) times daily.    Dispense:  12 suppository    Refill:  0    Order Specific Question:   Supervising Provider    Answer:   VINCENT, CAROL L [[id number]]   Increase fiber in diet Stool softners daily prepration h wipes after having bowel  movement RTO prn  Mary-Margaret Daphine Deutscher, FNP

## 2016-08-21 NOTE — Patient Instructions (Signed)
Hemorrhoids    Hemorrhoids are swollen veins in and around the rectum or anus. Hemorrhoids can cause pain, itching, or bleeding. Most of the time, they do not cause serious problems. They usually get better with diet changes, lifestyle changes, and other home treatments.  Follow these instructions at home:  Eating and drinking  · Eat foods that have fiber, such as whole grains, beans, nuts, fruits, and vegetables. Ask your doctor about taking products that have added fiber (fiber supplements).  · Drink enough fluid to keep your pee (urine) clear or pale yellow.  For Pain and Swelling  · Take a warm-water bath (sitz bath) for 20 minutes to ease pain. Do this 3-4 times a day.  · If directed, put ice on the painful area. It may be helpful to use ice between your warm baths.  ¨ Put ice in a plastic bag.  ¨ Place a towel between your skin and the bag.  ¨ Leave the ice on for 20 minutes, 2-3 times a day.  General instructions  · Take over-the-counter and prescription medicines only as told by your doctor.  ¨ Medicated creams and medicines that are inserted into the anus (suppositories) may be used or applied as told.  · Exercise often.  · Go to the bathroom when you have the urge to poop (to have a bowel movement). Do not wait.  · Avoid pushing too hard (straining) when you poop.  · Keep the butt area dry and clean. Use wet toilet paper or moist paper towels.  · Do not sit on the toilet for a long time.  Contact a doctor if:  · You have any of these:  ¨ Pain and swelling that do not get better with treatment or medicine.  ¨ Bleeding that will not stop.  ¨ Trouble pooping or you cannot poop.  ¨ Pain or swelling outside the area of the hemorrhoids.  This information is not intended to replace advice given to you by your health care provider. Make sure you discuss any questions you have with your health care provider.  Document Released: 09/28/2007 Document Revised: 05/27/2015 Document Reviewed: 09/02/2014  Elsevier  Interactive Patient Education © 2018 [employer]   

## 2016-08-25 ENCOUNTER — Ambulatory Visit: Payer: Medicaid Other | Admitting: Family

## 2016-10-27 DIAGNOSIS — H5310 Unspecified subjective visual disturbances: Secondary | ICD-10-CM | POA: Diagnosis not present

## 2016-10-27 DIAGNOSIS — H31091 Other chorioretinal scars, right eye: Secondary | ICD-10-CM | POA: Diagnosis not present

## 2016-10-31 ENCOUNTER — Encounter: Payer: Self-pay | Admitting: Family

## 2016-10-31 ENCOUNTER — Ambulatory Visit (INDEPENDENT_AMBULATORY_CARE_PROVIDER_SITE_OTHER): Payer: Medicaid Other | Admitting: Family

## 2016-10-31 VITALS — BP 140/87 | HR 83 | Temp 97.2°F | Ht 66.0 in | Wt 181.2 lb

## 2016-10-31 DIAGNOSIS — H81399 Other peripheral vertigo, unspecified ear: Secondary | ICD-10-CM | POA: Diagnosis not present

## 2016-10-31 DIAGNOSIS — M5030 Other cervical disc degeneration, unspecified cervical region: Secondary | ICD-10-CM | POA: Diagnosis not present

## 2016-10-31 DIAGNOSIS — H539 Unspecified visual disturbance: Secondary | ICD-10-CM | POA: Diagnosis not present

## 2016-10-31 DIAGNOSIS — R42 Dizziness and giddiness: Secondary | ICD-10-CM | POA: Diagnosis not present

## 2016-10-31 DIAGNOSIS — R51 Headache: Secondary | ICD-10-CM

## 2016-10-31 DIAGNOSIS — G8929 Other chronic pain: Secondary | ICD-10-CM

## 2016-10-31 NOTE — Progress Notes (Signed)
   Subjective:    Patient ID: Jeremy Dunn, male    DOB: 11/04/1980, 36 y.o.   MRN: [id number]  HPI PT presents to the office because he is seeing "flashing and dots" for the last week. PT states the flashing is constant and the "dots come and go". Pt has had his eye examined and was told he needed new glasses, but that his eyes were healthy.  PT denies any nausea, but has intermittent headaches and dizziness that started two years ago. Denies any changes in speech or gait.  Pt states his headaches and dizziness started the same time he was diagnosed with cervical degenerative disc. Pt reports constant aching pain in his neck of 8 out 10.   Review of Systems  All other systems reviewed and are negative.      Objective:   Physical Exam  Constitutional: He is oriented to person, place, and time. He appears well-developed and well-nourished. No distress.  HENT:  Head: Normocephalic.  Right Ear: External ear normal.  Left Ear: External ear normal.  Nose: Nose normal.  Mouth/Throat: Oropharynx is clear and moist.  Eyes: Pupils are equal, round, and reactive to light. Right eye exhibits no discharge. Left eye exhibits no discharge.  Neck: Normal range of motion. Neck supple. No thyromegaly present.  Cardiovascular: Normal rate, regular rhythm, normal heart sounds and intact distal pulses.   No murmur heard. Pulmonary/Chest: Effort normal and breath sounds normal. No respiratory distress. He has no wheezes.  Abdominal: Soft. Bowel sounds are normal. He exhibits no distension. There is no tenderness.  Musculoskeletal: Normal range of motion. He exhibits no edema or tenderness.  Neurological: He is alert and oriented to person, place, and time.  Decrease peripheral vision, tandem gait positive, negative romberg test  Skin: Skin is warm and dry. No rash noted. No erythema.  Psychiatric: He has a normal mood and affect. His behavior is normal. Judgment and thought content normal.  Vitals  reviewed.     BP 140/87   Pulse 83   Temp (!) 97.2 F (36.2 C) (Oral)   Ht _0  (1.676 m)   Wt 181 lb 3.2 oz (82.2 kg)   BMI 29.25 kg/m      Assessment & Plan:  1. Chronic nonintractable headache, unspecified headache type - CMP14+EGFR - CT Head Wo Contrast; Future - CBC with Differential/Platelet  2. Dizziness - CMP14+EGFR - CT Head Wo Contrast; Future - CBC with Differential/Platelet  3. Visual changes  - CMP14+EGFR - CT Head Wo Contrast; Future - CBC with Differential/Platelet  4. Other peripheral vertigo, unspecified ear - CT Head Wo Contrast; Future - CBC with Differential/Platelet  5. Degenerative cervical disc - CBC with Differential/Platelet  Will order CT scan of head to rule out lesion or mass If CT negative and symptoms continue may warrant neurologists referral  Falls precautions discussed Labs pending  RTO in 1 month or depending on labs and scan If symptoms worsen with greater changes in gait, vision, or speech go to ED   [name], FNP

## 2016-11-01 LAB — CBC WITH DIFFERENTIAL/PLATELET
BASOS: 1 %
Basophils Absolute: 0.1 10*3/uL (ref 0.0–0.2)
EOS (ABSOLUTE): 0.2 10*3/uL (ref 0.0–0.4)
EOS: 3 %
HEMATOCRIT: 44.4 % (ref 37.5–51.0)
Hemoglobin: 15 g/dL (ref 13.0–17.7)
IMMATURE GRANS (ABS): 0 10*3/uL (ref 0.0–0.1)
Immature Granulocytes: 0 %
Lymphocytes Absolute: 1.5 10*3/uL (ref 0.7–3.1)
Lymphs: 30 %
MCH: 29.2 pg (ref 26.6–33.0)
MCHC: 33.8 g/dL (ref 31.5–35.7)
MCV: 86 fL (ref 79–97)
Monocytes Absolute: 0.5 10*3/uL (ref 0.1–0.9)
Monocytes: 10 %
NEUTROS ABS: 2.8 10*3/uL (ref 1.4–7.0)
Neutrophils: 56 %
PLATELETS: 269 10*3/uL (ref 150–379)
RBC: 5.14 x10E6/uL (ref 4.14–5.80)
RDW: 14.1 % (ref 12.3–15.4)
WBC: 4.9 10*3/uL (ref 3.4–10.8)

## 2016-11-01 LAB — CMP14+EGFR
A/G RATIO: 2.2 (ref 1.2–2.2)
ALT: 21 IU/L (ref 0–44)
AST: 17 IU/L (ref 0–40)
Albumin: 4.7 g/dL (ref 3.5–5.5)
Alkaline Phosphatase: 110 IU/L (ref 39–117)
BUN/Creatinine Ratio: 8 — ABNORMAL LOW (ref 9–20)
BUN: 7 mg/dL (ref 6–20)
Bilirubin Total: 0.3 mg/dL (ref 0.0–1.2)
CALCIUM: 9.6 mg/dL (ref 8.7–10.2)
CO2: 25 mmol/L (ref 20–29)
CREATININE: 0.89 mg/dL (ref 0.76–1.27)
Chloride: 101 mmol/L (ref 96–106)
GFR, EST AFRICAN AMERICAN: 128 mL/min/{1.73_m2} (ref >59)
GFR, EST NON AFRICAN AMERICAN: 111 mL/min/{1.73_m2} (ref >59)
GLOBULIN, TOTAL: 2.1 g/dL (ref 1.5–4.5)
Glucose: 106 mg/dL — ABNORMAL HIGH (ref 65–99)
POTASSIUM: 4 mmol/L (ref 3.5–5.2)
SODIUM: 144 mmol/L (ref 134–144)
TOTAL PROTEIN: 6.8 g/dL (ref 6.0–8.5)

## 2016-11-08 ENCOUNTER — Ambulatory Visit (HOSPITAL_COMMUNITY): Payer: Self-pay

## 2016-11-10 ENCOUNTER — Ambulatory Visit (INDEPENDENT_AMBULATORY_CARE_PROVIDER_SITE_OTHER): Payer: Medicaid Other | Admitting: Family Medicine

## 2016-11-10 ENCOUNTER — Encounter: Payer: Self-pay | Admitting: Family Medicine

## 2016-11-10 DIAGNOSIS — I10 Essential (primary) hypertension: Secondary | ICD-10-CM

## 2016-11-10 MED ORDER — AMLODIPINE BESYLATE 2.5 MG PO TABS: 2.5000 mg | ORAL_TABLET | Freq: Every day | ORAL | 3 refills | Status: DC

## 2016-11-10 NOTE — Progress Notes (Signed)
BP 136/85   Pulse 87   Temp (!) 97.4 F (36.3 C) (Oral)   Ht 5\' 6"  (1.676 m)   Wt 177 lb (80.3 kg)   BMI 28.57 kg/m    Subjective:    Patient ID: Jeremy Dunn, male    DOB: November 25, 1980, 36 y.o.   MRN: [id number]  HPI: Jeremy Dunn is a 36 y.o. male presenting on 11/10/2016 for Hypertension (x 2 weeks, 148/107 earlier this morning; been running between 120-150/over 80-100)   HPI Hypertension Patient is currently on no medication but has been having elevated blood pressures at home that have been consistently in the 140s over 90s, and their blood pressure today is 136/85. Patient denies any lightheadedness or dizziness. Patient denies headaches, blurred vision, chest pains, shortness of breath, or weakness. Denies any side effects from medication and is content with current medication.  Patient has extensive family history of blood pressure issues and would like to try at least a low-dose of something for prevention  Relevant past medical, surgical, family and social history reviewed and updated as indicated. Interim medical history since our last visit reviewed. Allergies and medications reviewed and updated.  Review of Systems  Constitutional: Negative for chills and fever.  Eyes: Negative for discharge.  Respiratory: Negative for shortness of breath and wheezing.   Cardiovascular: Negative for chest pain and leg swelling.  Musculoskeletal: Negative for back pain and gait problem.  Skin: Negative for rash.  Neurological: Positive for headaches. Negative for dizziness, weakness, light-headedness and numbness.  All other systems reviewed and are negative.   Per HPI unless specifically indicated above        Objective:    BP 136/85   Pulse 87   Temp (!) 97.4 F (36.3 C) (Oral)   Ht 5\' 6"  (1.676 m)   Wt 177 lb (80.3 kg)   BMI 28.57 kg/m   Wt Readings from Last 3 Encounters:  11/10/16 177 lb (80.3 kg)  10/31/16 181 lb 3.2 oz (82.2 kg)  08/21/16 184 lb (83.5 kg)     Physical Exam  Constitutional: He is oriented to person, place, and time. He appears well-developed and well-nourished. No distress.  Eyes: Conjunctivae are normal. No scleral icterus.  Neck: Neck supple. No thyromegaly present.  Cardiovascular: Normal rate, regular rhythm, normal heart sounds and intact distal pulses.  No murmur heard. Pulmonary/Chest: Effort normal and breath sounds normal. No respiratory distress. He has no wheezes.  Musculoskeletal: Normal range of motion. He exhibits no edema.  Lymphadenopathy:    He has no cervical adenopathy.  Neurological: He is alert and oriented to person, place, and time. Coordination normal.  Skin: Skin is warm and dry. No rash noted. He is not diaphoretic.  Psychiatric: He has a normal mood and affect. His behavior is normal.  Nursing note and vitals reviewed.     Assessment & Plan:   Problem List Items Addressed This Visit      Cardiovascular and Mediastinum   Benign essential hypertension   Relevant Medications   amLODipine (NORVASC) 2.5 MG tablet   Other Relevant Orders   DME Other see comment       Follow up plan: Return in about 4 weeks (around 12/08/2016), or if symptoms worsen or fail to improve, for Hypertension recheck.  Counseling provided for all of the vaccine components No orders of the defined types were placed in this encounter.   Arville CareJoshua Dettinger, MD [facility] 11/10/2016, 12:37 PM

## 2016-11-14 ENCOUNTER — Ambulatory Visit: Payer: Medicaid Other | Admitting: Family

## 2016-11-15 ENCOUNTER — Ambulatory Visit (HOSPITAL_COMMUNITY)
Admission: RE | Admit: 2016-11-15 | Discharge: 2016-11-15 | Disposition: A | Discharge status: Home / Self Care | Payer: Medicaid Other | Source: Ambulatory Visit | Attending: Family | Admitting: Family

## 2016-11-15 DIAGNOSIS — H539 Unspecified visual disturbance: Secondary | ICD-10-CM

## 2016-11-15 DIAGNOSIS — G8929 Other chronic pain: Secondary | ICD-10-CM

## 2016-11-15 DIAGNOSIS — R51 Headache: Secondary | ICD-10-CM | POA: Insufficient documentation

## 2016-11-15 DIAGNOSIS — R42 Dizziness and giddiness: Secondary | ICD-10-CM | POA: Insufficient documentation

## 2016-11-15 DIAGNOSIS — H81399 Other peripheral vertigo, unspecified ear: Secondary | ICD-10-CM

## 2016-12-11 ENCOUNTER — Ambulatory Visit: Payer: Medicaid Other | Admitting: Family Medicine

## 2016-12-20 ENCOUNTER — Encounter: Payer: Self-pay | Admitting: Family Medicine

## 2016-12-20 ENCOUNTER — Ambulatory Visit: Payer: Medicaid Other | Admitting: Family Medicine

## 2016-12-20 VITALS — BP 134/87 | HR 91 | Temp 97.4°F | Ht 66.0 in | Wt 174.0 lb

## 2016-12-20 DIAGNOSIS — I10 Essential (primary) hypertension: Secondary | ICD-10-CM | POA: Diagnosis not present

## 2016-12-20 DIAGNOSIS — Z23 Encounter for immunization: Secondary | ICD-10-CM | POA: Diagnosis not present

## 2016-12-20 DIAGNOSIS — M5412 Radiculopathy, cervical region: Secondary | ICD-10-CM

## 2016-12-20 MED ORDER — AMLODIPINE BESYLATE 5 MG PO TABS: 5.0000 mg | ORAL_TABLET | Freq: Every day | ORAL | 3 refills | Status: DC

## 2016-12-20 MED ORDER — PREDNISONE 20 MG PO TABS: ORAL_TABLET | ORAL | 0 refills | Status: DC

## 2016-12-20 NOTE — Progress Notes (Signed)
BP (!) 142/90   Pulse 91   Temp (!) 97.4 F (36.3 C) (Oral)   Ht 5\' 6"  (1.676 m)   Wt 174 lb (78.9 kg)   BMI 28.08 kg/m    Subjective:    Patient ID: Jeremy Dunn, male    DOB: 01-24-1980, 36 y.o.   MRN: [id number]  HPI: Jeremy Dunn is a 36 y.o. male presenting on 12/20/2016 for Hypertension (recheck)   HPI Hypertension Patient is currently on amlodipine 2.5, and their blood pressure today is 142/90, he says is been running up as well at home sometimes and then sometimes it will be okay. Patient denies any lightheadedness or dizziness. Patient denies headaches, blurred vision, chest pains, shortness of breath, or weakness. Denies any side effects from medication and is content with current medication.   Neck pain with numbness in fingers Patient says over the past month he has been developing worsening neck pain that is causing him to have headaches.  He had a CT of the head which showed no issues in the brain itself.  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  He says that he has numbness in the fourth and fifth digits on his right hand but has recently worsened and developed some numbness in the fourth and fifth digits on his left hand as well.  He says the pain in his neck is about a 6 or 7 out of 10 and does not radiate anywhere else but the numbness does not go all the way down his arm to those 2 fingers.  He has not seen a neurology or orthopedic surgeon for this.  He says the pain will shoot more when he looks up but does slightly improve when he looks down.  Relevant past medical, surgical, family and social history reviewed and updated as indicated. Interim medical history since our last visit reviewed. Allergies and medications reviewed and updated.  Review of Systems  Constitutional: Negative for chills and fever.  Eyes: Negative for discharge.  Respiratory: Negative for shortness of breath and wheezing.     Cardiovascular: Negative for chest pain and leg swelling.  Musculoskeletal: Positive for back pain and neck pain. Negative for gait problem.  Skin: Negative for rash.  Neurological: Positive for numbness and headaches. Negative for dizziness, weakness and light-headedness.  All other systems reviewed and are negative.   Per HPI unless specifically indicated above        Objective:    BP (!) 142/90   Pulse 91   Temp (!) 97.4 F (36.3 C) (Oral)   Ht 5\' 6"  (1.676 m)   Wt 174 lb (78.9 kg)   BMI 28.08 kg/m   Wt Readings from Last 3 Encounters:  12/20/16 174 lb (78.9 kg)  11/10/16 177 lb (80.3 kg)  10/31/16 181 lb 3.2 oz (82.2 kg)    Physical Exam  Constitutional: He is oriented to person, place, and time. He appears well-developed and well-nourished. No distress.  Eyes: Conjunctivae are normal. No scleral icterus.  Cardiovascular: Normal rate, regular rhythm, normal heart sounds and intact distal pulses.  No murmur heard. Pulmonary/Chest: Effort normal and breath sounds normal. No respiratory distress. He has no wheezes. He has no rales.  Musculoskeletal: Normal range of motion. He exhibits no edema.       Cervical back: He exhibits tenderness and bony tenderness. He exhibits normal range of motion, no swelling, no deformity and normal pulse.  Back:  Neurological: He is alert and oriented to person, place, and time. Coordination normal.  Skin: Skin is warm and dry. No rash noted. He is not diaphoretic.  Psychiatric: He has a normal mood and affect. His behavior is normal.  Nursing note and vitals reviewed.       Assessment & Plan:   Problem List Items Addressed This Visit      Cardiovascular and Mediastinum   Benign essential hypertension - Primary   Relevant Medications   amLODipine (NORVASC) 5 MG tablet    Other Visit Diagnoses    Cervical radiculopathy       Relevant Medications   predniSONE (DELTASONE) 20 MG tablet   Other Relevant Orders   Ambulatory  referral to Orthopedic Surgery       Follow up plan: Return in about 3 months (around 03/20/2017), or if symptoms worsen or fail to improve, for Hypertension recheck.  Counseling provided for all of the vaccine components Orders Placed This Encounter  Procedures  . Ambulatory referral to Orthopedic Surgery    Arville CareJoshua Om Lizotte, MD [facility] 12/20/2016, 1:20 PM

## 2017-01-19 ENCOUNTER — Ambulatory Visit: Payer: Medicaid Other | Admitting: Family Medicine

## 2017-01-23 ENCOUNTER — Encounter: Payer: Self-pay | Admitting: Family

## 2017-05-01 ENCOUNTER — Ambulatory Visit: Payer: Medicaid Other | Admitting: Family

## 2017-05-01 ENCOUNTER — Encounter: Payer: Self-pay | Admitting: Family

## 2017-05-01 VITALS — BP 126/87 | HR 83 | Temp 98.9°F | Ht 66.0 in | Wt 174.0 lb

## 2017-05-01 DIAGNOSIS — Z0000 Encounter for general adult medical examination without abnormal findings: Secondary | ICD-10-CM

## 2017-05-01 DIAGNOSIS — F411 Generalized anxiety disorder: Secondary | ICD-10-CM | POA: Insufficient documentation

## 2017-05-01 DIAGNOSIS — F4010 Social phobia, unspecified: Secondary | ICD-10-CM | POA: Insufficient documentation

## 2017-05-01 DIAGNOSIS — F1011 Alcohol abuse, in remission: Secondary | ICD-10-CM | POA: Insufficient documentation

## 2017-05-01 DIAGNOSIS — F3181 Bipolar II disorder: Secondary | ICD-10-CM

## 2017-05-01 DIAGNOSIS — I10 Essential (primary) hypertension: Secondary | ICD-10-CM

## 2017-05-01 DIAGNOSIS — F6381 Intermittent explosive disorder: Secondary | ICD-10-CM | POA: Insufficient documentation

## 2017-05-01 DIAGNOSIS — R350 Frequency of micturition: Secondary | ICD-10-CM

## 2017-05-01 LAB — URINALYSIS, COMPLETE
BILIRUBIN UA: NEGATIVE
GLUCOSE, UA: NEGATIVE
KETONES UA: NEGATIVE
NITRITE UA: NEGATIVE
Protein, UA: NEGATIVE
RBC, UA: NEGATIVE
SPEC GRAV UA: 1.01 (ref 1.005–1.030)
Urobilinogen, Ur: 0.2 mg/dL (ref 0.2–1.0)
pH, UA: 5.5 (ref 5.0–7.5)

## 2017-05-01 LAB — MICROSCOPIC EXAMINATION: RBC, UA: NONE SEEN /hpf (ref 0–2)

## 2017-05-01 NOTE — Progress Notes (Signed)
   Subjective:    Patient ID: Jeremy Dunn, male    DOB: Sep 15, 1980, 37 y.o.   MRN: [id number]  Pt presents to the office today for CPE. Pt states he was followed by [facility], but was dismissed last month. He has GAD, intermittent explosive disorder, Bipolar 2,  Alcohol abuse, remission, and social phobia. Requesting new referral today. States he has two refills on his medications at this time.  Hypertension  This is a chronic problem. The problem has been resolved since onset. Associated symptoms include malaise/fatigue. Pertinent negatives include no headaches, peripheral edema or shortness of breath. Risk factors for coronary artery disease include obesity and family history. There is no history of kidney disease, CAD/MI, CVA or heart failure.  Urinary Frequency   This is a new problem. The current episode started more than 1 month ago. Associated symptoms include frequency.      Review of Systems  Constitutional: Positive for malaise/fatigue.  Respiratory: Negative for shortness of breath.   Genitourinary: Positive for frequency.  Neurological: Negative for headaches.  All other systems reviewed and are negative.      Objective:   Physical Exam  Constitutional: He is oriented to person, place, and time. He appears well-developed and well-nourished. No distress.  HENT:  Head: Normocephalic.  Right Ear: External ear normal.  Left Ear: External ear normal.  Nose: Nose normal.  Mouth/Throat: Oropharynx is clear and moist.  Eyes: Pupils are equal, round, and reactive to light. Right eye exhibits no discharge. Left eye exhibits no discharge.  Neck: Normal range of motion. Neck supple. No thyromegaly present.  Cardiovascular: Normal rate, regular rhythm, normal heart sounds and intact distal pulses.  No murmur heard. Pulmonary/Chest: Effort normal and breath sounds normal. No respiratory distress. He has no wheezes.  Abdominal: Soft. Bowel sounds are  normal. He exhibits no distension. There is no tenderness.  Musculoskeletal: Normal range of motion. He exhibits no edema or tenderness.  Neurological: He is alert and oriented to person, place, and time. He has normal reflexes.  Skin: Skin is warm and dry. No rash noted. No erythema.  Psychiatric: He has a normal mood and affect. His behavior is normal. Judgment and thought content normal.  Vitals reviewed.     BP 126/87   Pulse 83   Temp 98.9 F (37.2 C) (Oral)   Ht _0  (1.676 m)   Wt 174 lb (78.9 kg)   BMI 28.08 kg/m      Assessment & Plan:  1. Benign essential hypertension - BMP8+EGFR - CBC with Differential/Platelet  2. GAD (generalized anxiety disorder) - BMP8+EGFR - CBC with Differential/Platelet - Ambulatory referral to Psychiatry  3. Bipolar 2 disorder (HCC) - BMP8+EGFR - CBC with Differential/Platelet - Ambulatory referral to Psychiatry  4. Social phobia - BMP8+EGFR - CBC with Differential/Platelet - Ambulatory referral to Psychiatry  5. Intermittent explosive disorder - BMP8+EGFR - CBC with Differential/Platelet - Ambulatory referral to Psychiatry  6. Alcohol abuse, in remission - BMP8+EGFR - CBC with Differential/Platelet - Hepatic function panel - Ambulatory referral to Psychiatry  7. Annual physical exam - BMP8+EGFR - CBC with Differential/Platelet - Lipid panel - Hepatic function panel - TSH   8. Urinary frequency - Urinalysis, Complete    Continue all meds Labs pending Health Maintenance reviewed Diet and exercise encouraged RTO 6 months   [name], FNP

## 2017-05-01 NOTE — Patient Instructions (Signed)
Liver Function Tests  Liver function tests are blood tests to see how well your liver is working. The proteins and enzymes measured in the test can alert your health care provider to inflammation, damage, or disease in your liver. It is common to have liver function tests:   During annual physical exams.   When you are taking certain medicines.   If you have liver disease.   If you drink a lot of alcohol.   When you are not feeling well.   When you have other conditions that may affect the liver.    Substances measured may include:   Alanine transaminase (ALT). This is an enzyme in the liver.   Aspartate transaminase (AST). This is an enzyme in the liver, heart, and muscles.   Alkaline phosphatase (ALP). This is a protein in the liver, bile ducts, and bone. It is also in other body tissues.   Total bilirubin. This is a yellow pigment in bile.   Albumin. This is a protein in the liver.   Prothrombin time and international normalized ratio (PT and INR). PT measures the time that it takes for your blood to clot. INR is a calculation of blood clotting time based upon your PT result. It is also calculated based on normal ranges defined by the laboratory that processed your lab test.   Total protein. This measures two proteins, albumin and globulin, found in the blood.    How do I prepare for this test?  How you prepare will depend on which tests are being done and the reason why these tests are being done. You may need to:   Avoid eating for 4-6 hours before the test or as directed by your health care provider.   Stop taking certain medicines prior to your blood test as directed by your health care provider.    What do the results mean?  It is your responsibility to obtain your test results. Ask the lab or department performing the test when and how you will get your results. Contact your health care provider to discuss any questions you have about your results.  RANGE OF NORMAL VALUES  Ranges for normal  values may vary among different labs and hospitals. You should always check with your health care provider after having lab work or other tests done to discuss the meaning of your test results and whether your values are considered within normal limits.  The following are normal ranges for substances measured in liver function tests:  ALT   Infant: may be twice as high as adult values.   Child or adult: 4-36 international units/L at 37C or 4-36 units/L (SI units).   Elderly: may be slightly higher than adult values.  AST   Newborn 0-5 days old: 35-140 units/L.   Child under 3 years old: 15-60 units/L.   3-6 years old: 15-50 units/L.   6-12 years old: 10-50 units/L.   12-18 years old: 10-40 units/L.   Adult: 0-35 units/L or 0-0.58 microkatal/L (SI units).   Elderly: slightly higher than adults.  ALP   Child under 2 years old: 85-235 units/L.   2-8 years old: 65-210 units/L.   9-15 years old: 60-300 units/L.   16-21 years old: 30-200 units/L.   Adult: 30-120 units/L or 0.5-2.0 microkatal/L (SI units).   Elderly: slightly higher than adult.  Total bilirubin   Newborn: 1.0-12.0 mg/dL or 17.1-205 micromoles/L (SI units).   Adult, elderly, or child: 0.3-1.0 mg/dL or 5.1-17 micromoles/L.  Albumin     Premature infant: 3.0-4.2 g/dL.   Newborn: 3.5-5.4 g/dL.   Infant: 4.4-5.4 g/dL.   Child: 4.0-5.9 g/dL.   Adult or elderly: 3.5-5.0 g/dL or 35-50 g/L (SI units).  PT   11.0-12.5 seconds; 85%-100%.  INR   0.8-1.1.  Total protein   Premature infant: 4.2-7.6 g/dL.   Newborn: 4.6-7.4 g/dL.   Infant: 6.0-6.7 g/dL.   Child: 6.2-8.0 g/dL.   Adult or elderly: 6.4-8.3 g/dL or 64-83 g/L (SI units).  MEANING OF RESULTS OUTSIDE NORMAL VALUE RANGES  Sometimes test results can be abnormal due to other factors, such as medicines, exercise, or pregnancy. Follow up with your health care provider if you have any questions about test results outside the normal value ranges.  ALT   Levels above the normal range,  along with other test results, may indicate liver disease.  AST   Levels above the normal range, along with other test results, may indicate liver disease. Sometimes levels also increase after burns, surgery, heart attack, muscle damage, or seizure.  ALP   Levels above the normal range, along with other test results, may indicate biliary obstruction, diseases of the liver, bone disease, thyroid disease, tumors, fractures, leukemia or lymphoma, or several other conditions. People with blood type O or B may show higher levels after a fatty meal.   Levels below the normal range, along with other test results, may indicate bone and teeth conditions, malnutrition, protein deficiency, or Wilson disease.  Total bilirubin   Levels above the normal range, along with other test results, may indicate problems with the liver, gallbladder, or bile ducts.  Albumin   Levels above the normal range, along with other test results, may indicate dehydration. They may also be caused by a diet that is high in protein. Sometimes, the band placed around the upper arm during the process of drawing blood can cause the level of this protein in your blood to rise and give you a result above the normal range.   Levels below the normal range, along with other tests results, may indicate kidney disease, liver disease, or malabsorption of nutrients.  PT and INR   Levels above the normal range mean your blood is clotting slower than normal. This may be due to blood disorders, liver disorders, or low levels of vitamin K.  Total protein   Levels above the normal range, along with other test results, may be due to infection or other diseases.   Levels below the normal range, along with other test results, may be due to an immune system disorder, bleeding, burns, kidney disorder, liver disease, trouble absorbing or getting enough nutrients, or other conditions that affect the intestines.  Talk with your health care provider to discuss your  results, treatment options, and if necessary, the need for more tests. Talk with your health care provider if you have any questions about your results.  This information is not intended to replace advice given to you by your health care provider. Make sure you discuss any questions you have with your health care provider.  Document Released: 01/22/2004 Document Revised: 08/25/2015 Document Reviewed: 04/24/2013  Elsevier Interactive Patient Education  2018 [employer]

## 2017-05-02 LAB — HEPATIC FUNCTION PANEL
ALBUMIN: 5 g/dL (ref 3.5–5.5)
ALT: 31 IU/L (ref 0–44)
AST: 21 IU/L (ref 0–40)
Alkaline Phosphatase: 108 IU/L (ref 39–117)
BILIRUBIN, DIRECT: 0.15 mg/dL (ref 0.00–0.40)
Bilirubin Total: 0.6 mg/dL (ref 0.0–1.2)
Total Protein: 7.3 g/dL (ref 6.0–8.5)

## 2017-05-02 LAB — BMP8+EGFR
BUN/Creatinine Ratio: 8 — ABNORMAL LOW (ref 9–20)
BUN: 7 mg/dL (ref 6–20)
CO2: 27 mmol/L (ref 20–29)
CREATININE: 0.86 mg/dL (ref 0.76–1.27)
Calcium: 9.4 mg/dL (ref 8.7–10.2)
Chloride: 100 mmol/L (ref 96–106)
GFR, EST AFRICAN AMERICAN: 129 mL/min/{1.73_m2} (ref >59)
GFR, EST NON AFRICAN AMERICAN: 112 mL/min/{1.73_m2} (ref >59)
Glucose: 73 mg/dL (ref 65–99)
POTASSIUM: 4.2 mmol/L (ref 3.5–5.2)
SODIUM: 143 mmol/L (ref 134–144)

## 2017-05-02 LAB — CBC WITH DIFFERENTIAL/PLATELET
BASOS: 1 %
Basophils Absolute: 0 10*3/uL (ref 0.0–0.2)
EOS (ABSOLUTE): 0.2 10*3/uL (ref 0.0–0.4)
EOS: 2 %
HEMATOCRIT: 45.1 % (ref 37.5–51.0)
Hemoglobin: 15.5 g/dL (ref 13.0–17.7)
IMMATURE GRANULOCYTES: 0 %
Immature Grans (Abs): 0 10*3/uL (ref 0.0–0.1)
LYMPHS ABS: 2.2 10*3/uL (ref 0.7–3.1)
Lymphs: 30 %
MCH: 29.1 pg (ref 26.6–33.0)
MCHC: 34.4 g/dL (ref 31.5–35.7)
MCV: 85 fL (ref 79–97)
MONOS ABS: 0.6 10*3/uL (ref 0.1–0.9)
Monocytes: 9 %
NEUTROS ABS: 4.2 10*3/uL (ref 1.4–7.0)
Neutrophils: 58 %
PLATELETS: 296 10*3/uL (ref 150–379)
RBC: 5.32 x10E6/uL (ref 4.14–5.80)
RDW: 14.4 % (ref 12.3–15.4)
WBC: 7.1 10*3/uL (ref 3.4–10.8)

## 2017-05-02 LAB — LIPID PANEL
CHOL/HDL RATIO: 5.3 ratio — AB (ref 0.0–5.0)
Cholesterol, Total: 196 mg/dL (ref 100–199)
HDL: 37 mg/dL — AB (ref >39)
LDL Calculated: 130 mg/dL — ABNORMAL HIGH (ref 0–99)
Triglycerides: 146 mg/dL (ref 0–149)
VLDL Cholesterol Cal: 29 mg/dL (ref 5–40)

## 2017-05-02 LAB — TSH: TSH: 1.22 u[IU]/mL (ref 0.450–4.500)

## 2017-05-03 ENCOUNTER — Telehealth: Payer: Self-pay | Admitting: Family

## 2017-05-03 NOTE — Telephone Encounter (Signed)
Wife aware of results.

## 2017-05-22 ENCOUNTER — Other Ambulatory Visit: Payer: Self-pay | Admitting: Family Medicine

## 2017-05-22 DIAGNOSIS — I10 Essential (primary) hypertension: Secondary | ICD-10-CM

## 2017-05-22 MED ORDER — AMLODIPINE BESYLATE 5 MG PO TABS: 5.0000 mg | ORAL_TABLET | Freq: Every day | ORAL | 2 refills | Status: DC

## 2017-08-30 ENCOUNTER — Other Ambulatory Visit: Payer: Self-pay | Admitting: Family

## 2017-08-30 DIAGNOSIS — I10 Essential (primary) hypertension: Secondary | ICD-10-CM

## 2017-09-16 ENCOUNTER — Emergency Department (HOSPITAL_COMMUNITY)
Admission: EM | Admit: 2017-09-16 | Discharge: 2017-09-16 | Disposition: A | Discharge status: Home / Self Care | Payer: Medicaid Other | Attending: Emergency Medicine | Admitting: Emergency Medicine

## 2017-09-16 ENCOUNTER — Encounter (HOSPITAL_COMMUNITY): Payer: Self-pay | Admitting: Emergency Medicine

## 2017-09-16 DIAGNOSIS — Z87891 Personal history of nicotine dependence: Secondary | ICD-10-CM | POA: Diagnosis not present

## 2017-09-16 DIAGNOSIS — Z79899 Other long term (current) drug therapy: Secondary | ICD-10-CM | POA: Insufficient documentation

## 2017-09-16 DIAGNOSIS — K047 Periapical abscess without sinus: Secondary | ICD-10-CM | POA: Insufficient documentation

## 2017-09-16 DIAGNOSIS — I10 Essential (primary) hypertension: Secondary | ICD-10-CM | POA: Insufficient documentation

## 2017-09-16 DIAGNOSIS — K0889 Other specified disorders of teeth and supporting structures: Secondary | ICD-10-CM | POA: Diagnosis present

## 2017-09-16 MED ORDER — AMOXICILLIN 250 MG PO CAPS
500.0000 mg | ORAL_CAPSULE | Freq: Once | ORAL | Status: AC
  Administered 2017-09-16: 500 mg via ORAL
  Filled 2017-09-16: qty 2

## 2017-09-16 MED ORDER — ACETAMINOPHEN-CODEINE #3 300-30 MG PO TABS: 1.0000 | ORAL_TABLET | Freq: Four times a day (QID) | ORAL | 0 refills | Status: DC | PRN

## 2017-09-16 MED ORDER — ACETAMINOPHEN-CODEINE #3 300-30 MG PO TABS
1.0000 | ORAL_TABLET | Freq: Once | ORAL | Status: AC
  Administered 2017-09-16: 1 via ORAL
  Filled 2017-09-16: qty 1

## 2017-09-16 MED ORDER — AMOXICILLIN 500 MG PO CAPS: 500.0000 mg | ORAL_CAPSULE | Freq: Three times a day (TID) | ORAL | 0 refills | Status: AC

## 2017-09-16 NOTE — Discharge Instructions (Addendum)
Your pain appears to be due to dental infection without abscess.  Use the medicines prescribed and call your dentist tomorrow for followup care.  You may take the tylenol #3 prescribed for pain relief.  This will make you drowsy - do not drive within 4 hours of taking this medication.\

## 2017-09-16 NOTE — ED Provider Notes (Signed)
[facility] EMERGENCY DEPARTMENT Provider Note   CSN: [id number] Arrival date & time: 09/16/17  1455     History   Chief Complaint Chief Complaint  Patient presents with  . Dental Pain    HPI Jeremy Dunn is a 37 y.o. male presenting with right upper molar dental pain and gingival swelling which started 2 days ago.  He reports bad teeth which he is slowly getting extracted by his dentist at [facility]. He denies fevers, chills, nausea or vomiting, also no complaint of difficulty swallowing, although chewing makes pain worse.  The patient has tried ibuprofen without relief of symptoms.    .  The history is provided by the patient.    Past Medical History:  Diagnosis Date  . Renal disorder    renal stones  . Seizures [facility])     Patient Active Problem List   Diagnosis Date Noted  . GAD (generalized anxiety disorder) 05/01/2017  . Bipolar 2 disorder (HCC) 05/01/2017  . Social phobia 05/01/2017  . Intermittent explosive disorder 05/01/2017  . Alcohol abuse, in remission 05/01/2017  . Benign essential hypertension 11/10/2016    Past Surgical History:  Procedure Laterality Date  . HERNIA REPAIR          Home Medications    Prior to Admission medications   Medication Sig Start Date End Date Taking? Authorizing Provider  acetaminophen-codeine (TYLENOL #3) 300-30 MG tablet Take 1-2 tablets by mouth every 6 (six) hours as needed for moderate pain. 09/16/17   Burgess Amor, PA-C  amLODipine (NORVASC) 5 MG tablet TAKE 1 TABLET BY MOUTH EVERY DAY 08/31/17   Jannifer Rodney A, FNP  amoxicillin (AMOXIL) 500 MG capsule Take 1 capsule (500 mg total) by mouth 3 (three) times daily for 10 days. 09/16/17 09/26/17  Burgess Amor, PA-C  busPIRone (BUSPAR) 10 MG tablet  04/18/17   [provider]  lamoTRIgine (LAMICTAL) 150 MG tablet MAY TAKE 2 TABLETS BY MOUTH ONCE DAILY OR TAKE ONE TABLET BY MOUTH TWICE DAILY 04/11/16   [provider]  PARoxetine (PAXIL)  40 MG tablet Take 40 mg by mouth daily. 10/02/16   [provider]    Family History Family History  Problem Relation Age of Onset  . Hypertension Mother   . Cancer Father     Social History Social History   Tobacco Use  . Smoking status: Former Smoker    Packs/day: 1.00    Types: Cigarettes  . Smokeless tobacco: Never Used  Substance Use Topics  . Alcohol use: Not Currently  . Drug use: No     Allergies   Sulfa antibiotics   Review of Systems Review of Systems  Constitutional: Negative for fever.  HENT: Positive for dental problem. Negative for facial swelling and sore throat.   Respiratory: Negative for shortness of breath.   Musculoskeletal: Negative for neck pain and neck stiffness.     Physical Exam Updated Vital Signs BP (!) 153/95 (BP Location: Right Arm)   Pulse 82   Temp 97.7 F (36.5 C) (Oral)   Resp 16   Ht 5\' 5"  (1.651 m)   Wt 88.5 kg   SpO2 99%   BMI 32.45 kg/m   Physical Exam  Constitutional: He is oriented to person, place, and time. He appears well-developed and well-nourished. No distress.  HENT:  Head: Normocephalic and atraumatic.  Right Ear: Tympanic membrane and external ear normal.  Left Ear: Tympanic membrane and external ear normal.  Mouth/Throat: Oropharynx is clear and  moist and mucous membranes are normal. No oral lesions. No trismus in the jaw. No dental abscesses.    Eyes: Conjunctivae are normal.  Neck: Normal range of motion. Neck supple.  Cardiovascular: Normal rate and normal heart sounds.  Pulmonary/Chest: Effort normal.  Abdominal: He exhibits no distension.  Musculoskeletal: Normal range of motion.  Lymphadenopathy:    He has no cervical adenopathy.  Neurological: He is alert and oriented to person, place, and time.  Skin: Skin is warm and dry. No erythema.  Psychiatric: He has a normal mood and affect.     ED Treatments / Results  Labs (all labs ordered are listed, but only abnormal results are  displayed) Labs Reviewed - No data to display  EKG None  Radiology No results found.  Procedures Procedures (including critical care time)  Medications Ordered in ED Medications  amoxicillin (AMOXIL) capsule 500 mg (500 mg Oral Given 09/16/17 1526)  acetaminophen-codeine (TYLENOL #3) 300-30 MG per tablet 1 tablet (1 tablet Oral Given 09/16/17 1526)     Initial Impression / Assessment and Plan / ED Course  I have reviewed the triage vital signs and the nursing notes.  Pertinent labs & imaging results that were available during my care of the patient were reviewed by me and considered in my medical decision making (see chart for details).     F/u dentistry. Amoxil, tylenol #3, [place] controlled substance database reviewed. No facial cellulitis, no signs of ludwigs.  Prn f/u anticipated.  Final Clinical Impressions(s) / ED Diagnoses   Final diagnoses:  Dental infection    ED Discharge Orders         Ordered    acetaminophen-codeine (TYLENOL #3) 300-30 MG tablet  Every 6 hours PRN     09/16/17 1524    amoxicillin (AMOXIL) 500 MG capsule  3 times daily     09/16/17 1524           Victoriano Laindol, Hiren Peplinski, PA-C 09/16/17 1531    Mesner, Barbara CowerJason, MD 09/16/17 2039

## 2017-09-16 NOTE — ED Triage Notes (Signed)
Pt reports upper right dental pain for several days.

## 2017-10-21 ENCOUNTER — Other Ambulatory Visit: Payer: Self-pay | Admitting: Family

## 2017-10-21 DIAGNOSIS — I10 Essential (primary) hypertension: Secondary | ICD-10-CM

## 2017-10-22 NOTE — Telephone Encounter (Signed)
Last seen 04/04/17

## 2018-03-18 ENCOUNTER — Other Ambulatory Visit: Payer: Self-pay | Admitting: Family

## 2018-03-18 DIAGNOSIS — I10 Essential (primary) hypertension: Secondary | ICD-10-CM

## 2018-03-28 ENCOUNTER — Ambulatory Visit (INDEPENDENT_AMBULATORY_CARE_PROVIDER_SITE_OTHER): Payer: Medicaid Other | Admitting: Family

## 2018-03-28 ENCOUNTER — Ambulatory Visit (INDEPENDENT_AMBULATORY_CARE_PROVIDER_SITE_OTHER): Payer: Medicaid Other

## 2018-03-28 ENCOUNTER — Encounter: Payer: Self-pay | Admitting: Family

## 2018-03-28 ENCOUNTER — Other Ambulatory Visit: Payer: Self-pay

## 2018-03-28 VITALS — BP 134/94 | HR 93 | Temp 98.2°F | Ht 65.0 in | Wt 187.6 lb

## 2018-03-28 DIAGNOSIS — M549 Dorsalgia, unspecified: Secondary | ICD-10-CM

## 2018-03-28 DIAGNOSIS — R1030 Lower abdominal pain, unspecified: Secondary | ICD-10-CM

## 2018-03-28 DIAGNOSIS — K5900 Constipation, unspecified: Secondary | ICD-10-CM

## 2018-03-28 LAB — URINALYSIS, COMPLETE
BILIRUBIN UA: NEGATIVE
Glucose, UA: NEGATIVE
Ketones, UA: NEGATIVE
Leukocytes, UA: NEGATIVE
Nitrite, UA: NEGATIVE
PH UA: 6 (ref 5.0–7.5)
Protein, UA: NEGATIVE
UUROB: 1 mg/dL (ref 0.2–1.0)

## 2018-03-28 LAB — MICROSCOPIC EXAMINATION
Bacteria, UA: NONE SEEN
EPITHELIAL CELLS (NON RENAL): NONE SEEN /HPF (ref 0–10)
RBC MICROSCOPIC, UA: NONE SEEN /HPF (ref 0–2)
Renal Epithel, UA: NONE SEEN /hpf
WBC UA: NONE SEEN /HPF (ref 0–5)

## 2018-03-28 MED ORDER — POLYETHYLENE GLYCOL 3350 17 GM/SCOOP PO POWD: 17.0000 g | Freq: Two times a day (BID) | ORAL | 1 refills | Status: DC | PRN

## 2018-03-28 NOTE — Patient Instructions (Signed)

## 2018-03-28 NOTE — Progress Notes (Signed)
Subjective:    Patient ID: Jeremy Dunn, male    DOB: [birth year], 38 y.o.   MRN: [id number]  Chief Complaint  Patient presents with  . Back Pain  . lower abdominal pain    Back Pain  Associated symptoms include abdominal pain. Pertinent negatives include no dysuria or fever.  Abdominal Pain  This is a new problem. The current episode started today. The onset quality is sudden. The problem occurs constantly. The problem has been unchanged. The pain is located in the RLQ and LLQ. The pain is at a severity of 8/10. The pain is moderate. The quality of the pain is cramping. The abdominal pain radiates to the left flank and right flank. Pertinent negatives include no belching, constipation, diarrhea, dysuria, fever, flatus, frequency, hematuria, nausea or vomiting. The pain is aggravated by certain positions. The pain is relieved by being still. He has tried acetaminophen for the symptoms. The treatment provided no relief.      Review of Systems  Constitutional: Negative for fever.  Gastrointestinal: Positive for abdominal pain. Negative for constipation, diarrhea, flatus, nausea and vomiting.  Genitourinary: Negative for dysuria, frequency and hematuria.  Musculoskeletal: Positive for back pain.       Objective:   Physical Exam Vitals signs reviewed.  Constitutional:      General: He is not in acute distress.    Appearance: He is well-developed.  HENT:     Head: Normocephalic.  Eyes:     General:        Right eye: No discharge.        Left eye: No discharge.     Pupils: Pupils are equal, round, and reactive to light.  Neck:     Musculoskeletal: Normal range of motion and neck supple.     Thyroid: No thyromegaly.  Cardiovascular:     Rate and Rhythm: Normal rate and regular rhythm.     Heart sounds: Normal heart sounds. No murmur.  Pulmonary:     Effort: Pulmonary effort is normal. No respiratory distress.     Breath sounds: Normal breath sounds. No wheezing.   Abdominal:     General: Bowel sounds are normal. There is no distension.     Palpations: Abdomen is soft. There is no mass.     Tenderness: There is abdominal tenderness (mild lower tenderness). There is right CVA tenderness and left CVA tenderness. There is no guarding or rebound.     Hernia: No hernia is present.  Musculoskeletal: Normal range of motion.        General: No tenderness.  Skin:    General: Skin is warm and dry.     Findings: No erythema or rash.  Neurological:     Mental Status: He is alert and oriented to person, place, and time.     Cranial Nerves: No cranial nerve deficit.     Deep Tendon Reflexes: Reflexes are normal and symmetric.  Psychiatric:        Behavior: Behavior normal.        Thought Content: Thought content normal.        Judgment: Judgment normal.       BP (!) 134/94   Pulse 93   Temp 98.2 F (36.8 C) (Oral)   Ht 5\' 5"  (1.651 m)   Wt 187 lb 9.6 oz (85.1 kg)   BMI 31.22 kg/m      Assessment & Plan:  Jeremy Dunn comes in today with chief complaint of Back Pain and lower  abdominal pain   Diagnosis and orders addressed:  1. Back pain, unspecified back location, unspecified back pain laterality, unspecified chronicity - Urinalysis, Complete - DG Abd 1 View; Future - CBC with Differential/Platelet  2. Lower abdominal pain - DG Abd 1 View; Future - polyethylene glycol powder (GLYCOLAX/MIRALAX) powder; Take 17 g by mouth 2 (two) times daily as needed.  Dispense: 3350 g; Refill: 1 - CBC with Differential/Platelet  3. Constipation, unspecified constipation type Start Miralax BID Force fluids Encourage healthy diet and exercise  - polyethylene glycol powder (GLYCOLAX/MIRALAX) powder; Take 17 g by mouth 2 (two) times daily as needed.  Dispense: 3350 g; Refill: 1 - CBC with Differential/Platelet   Labs pending Diet and exercise encouraged  Follow up plan: As needed or if pain worsens or does not improve   Jannifer Rodney, FNP

## 2018-03-29 LAB — CBC WITH DIFFERENTIAL/PLATELET
Basophils Absolute: 0.1 10*3/uL (ref 0.0–0.2)
Basos: 1 %
EOS (ABSOLUTE): 0.1 10*3/uL (ref 0.0–0.4)
EOS: 2 %
HEMATOCRIT: 45.7 % (ref 37.5–51.0)
Hemoglobin: 15.8 g/dL (ref 13.0–17.7)
Immature Grans (Abs): 0 10*3/uL (ref 0.0–0.1)
Immature Granulocytes: 0 %
LYMPHS ABS: 1.7 10*3/uL (ref 0.7–3.1)
Lymphs: 28 %
MCH: 28.5 pg (ref 26.6–33.0)
MCHC: 34.6 g/dL (ref 31.5–35.7)
MCV: 82 fL (ref 79–97)
MONOS ABS: 0.6 10*3/uL (ref 0.1–0.9)
Monocytes: 10 %
Neutrophils Absolute: 3.6 10*3/uL (ref 1.4–7.0)
Neutrophils: 59 %
Platelets: 295 10*3/uL (ref 150–450)
RBC: 5.55 x10E6/uL (ref 4.14–5.80)
RDW: 13.4 % (ref 11.6–15.4)
WBC: 6.1 10*3/uL (ref 3.4–10.8)

## 2018-04-04 ENCOUNTER — Telehealth: Payer: Self-pay | Admitting: Family Medicine

## 2018-04-04 MED ORDER — LACTULOSE 20 GM/30ML PO SOLN: 15.0000 mL | Freq: Two times a day (BID) | ORAL | 0 refills | Status: DC | PRN

## 2018-04-04 MED ORDER — BACLOFEN 10 MG PO TABS: 10.0000 mg | ORAL_TABLET | Freq: Three times a day (TID) | ORAL | 0 refills | Status: DC

## 2018-04-04 MED ORDER — DICLOFENAC SODIUM 75 MG PO TBEC: 75.0000 mg | DELAYED_RELEASE_TABLET | Freq: Two times a day (BID) | ORAL | 0 refills | Status: DC

## 2018-04-04 NOTE — Telephone Encounter (Signed)
Spoke with Pt wife she verbalized understanding and did question to what the pt can do to help with him going to the bathroom since he is having issue with that and the miralax is not helping

## 2018-04-04 NOTE — Telephone Encounter (Signed)
Diclofenac and baclofen Prescriptions sent to pharmacy. This should help with pain. Will treat as muscular pain.

## 2018-04-04 NOTE — Telephone Encounter (Signed)
Please review and advise.

## 2018-04-04 NOTE — Addendum Note (Signed)
Addended by: Jannifer Rodney A on: 04/04/2018 03:43 PM   Modules accepted: Orders

## 2018-04-04 NOTE — Telephone Encounter (Signed)
He can increase miralax to BID and I have sent in lactulose to his pharmacy that he can take with the Miralalx

## 2018-04-05 NOTE — Telephone Encounter (Signed)
Patient 's wife aware and verbalized understanding. °

## 2018-04-09 ENCOUNTER — Telehealth: Payer: Self-pay

## 2018-04-09 NOTE — Telephone Encounter (Signed)
FYI, Diclofenac non-preferred, preferred are Indomethacin capsule, Ketorolac tablet, Meloxicam tablet, Naproxen EC tablet, Naproxen tablet or Sulindac tablet

## 2018-05-09 ENCOUNTER — Other Ambulatory Visit: Payer: Self-pay

## 2018-05-13 ENCOUNTER — Ambulatory Visit: Payer: Medicaid Other | Admitting: Family

## 2018-05-29 ENCOUNTER — Telehealth: Payer: Self-pay | Admitting: Family

## 2018-12-18 IMAGING — CT CT HEAD W/O CM
3 series · 16 of 47 positions shown, 19 images · non-contrast
Comparison: Head CT 886

CLINICAL DATA: Peripheral vision disturbances.  Vertigo.  Headache.

EXAM:
CT HEAD WITHOUT CONTRAST
TECHNIQUE: Contiguous axial images were obtained from the base of the skull
through the vertex without intravenous contrast.

[Series 2: head wo · axial · 0.43mm/px · z∈[+54,+179]mm · 10 of 31 slices shown, 13 images]
[im 3/31  brain]
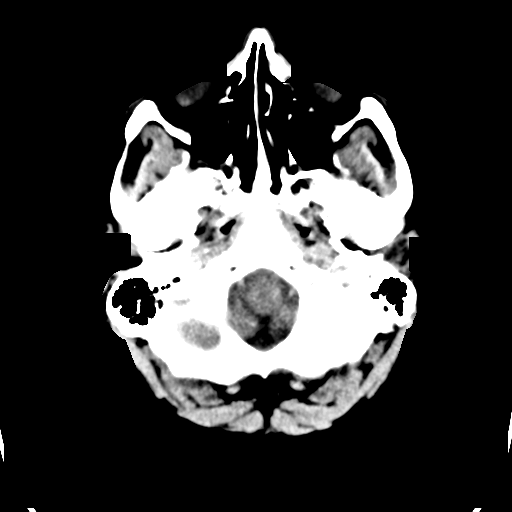
[im 3/31  bone]
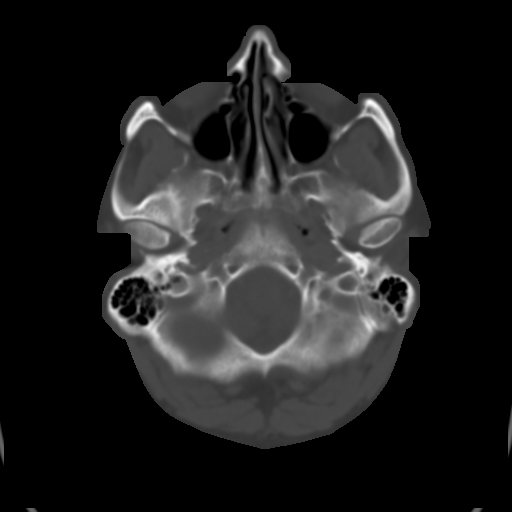
[im 6/31  brain]
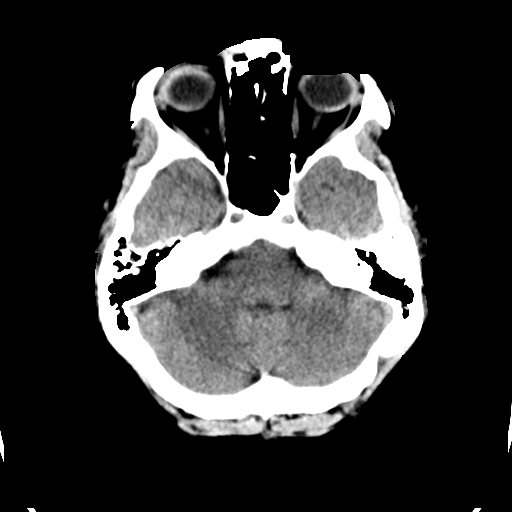
[im 9/31  brain]
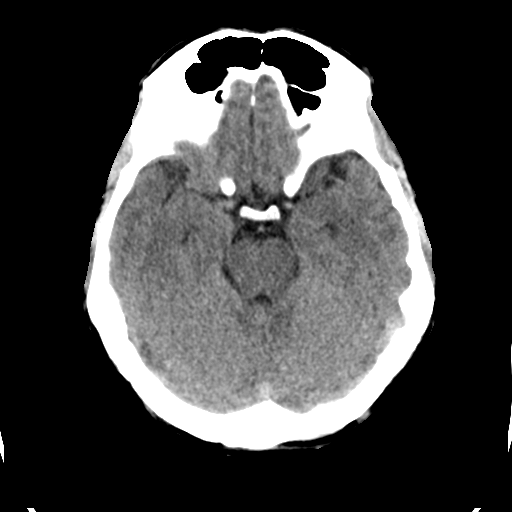
[im 11/31  brain]
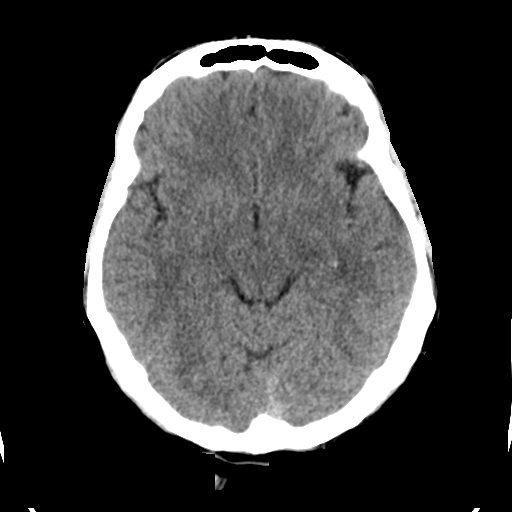
[im 14/31  brain]
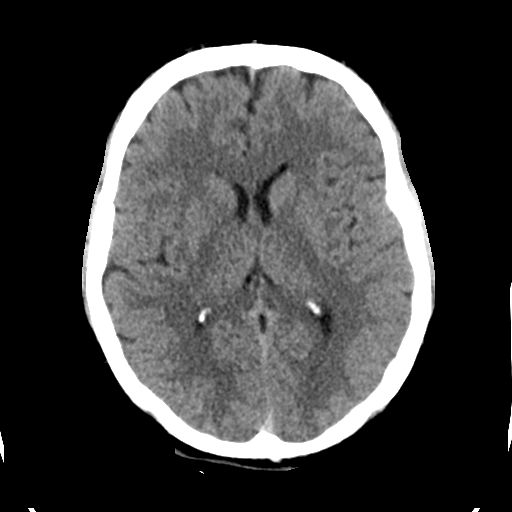
[im 14/31  bone]
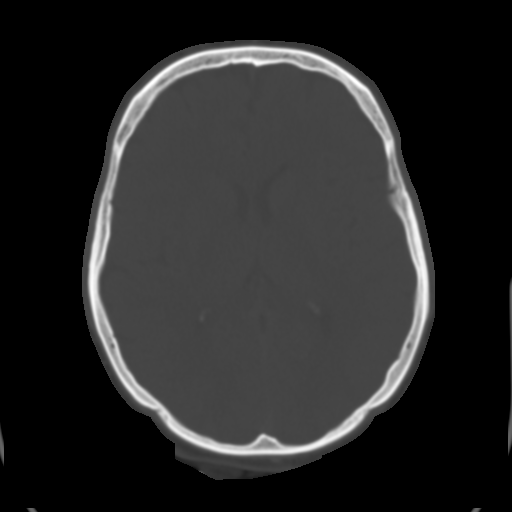
[im 17/31  brain]
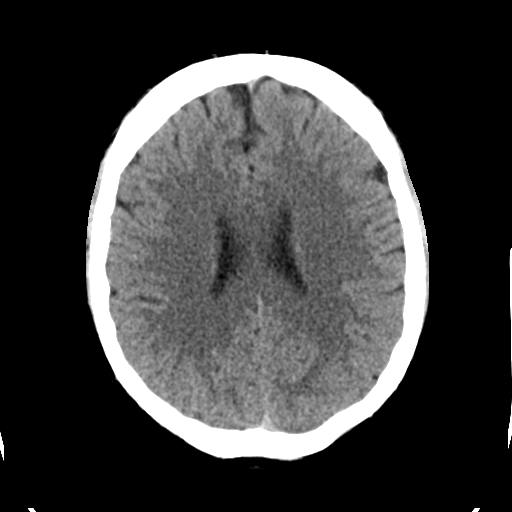
[im 20/31  brain]
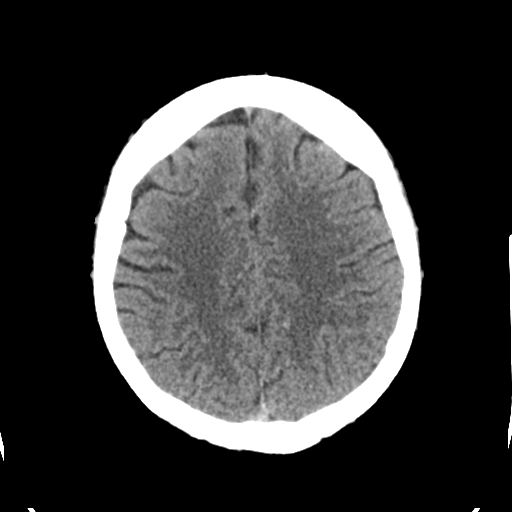
[im 23/31  brain]
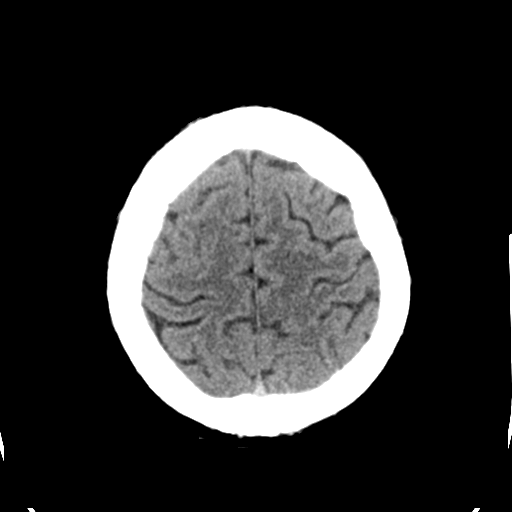
[im 25/31  brain]
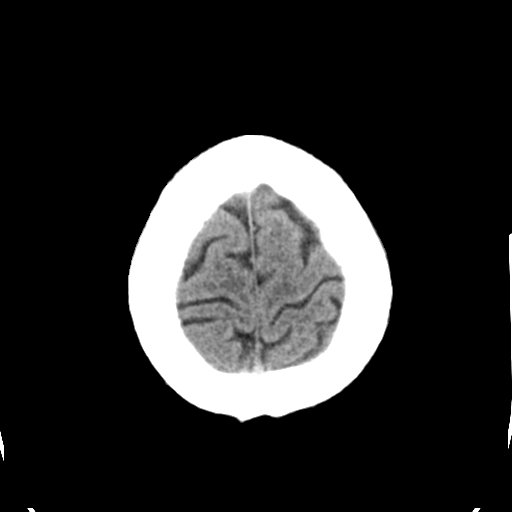
[im 25/31  bone]
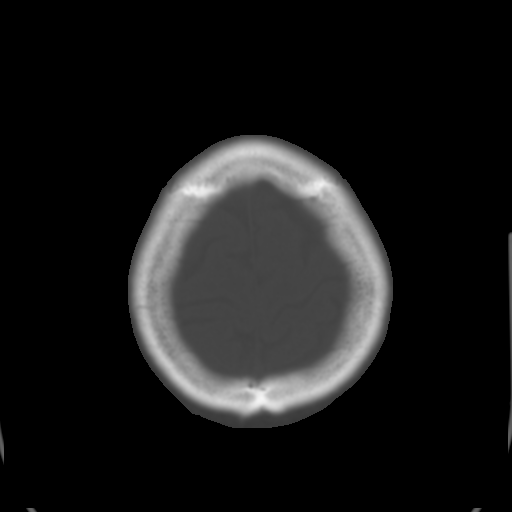
[im 28/31  brain]
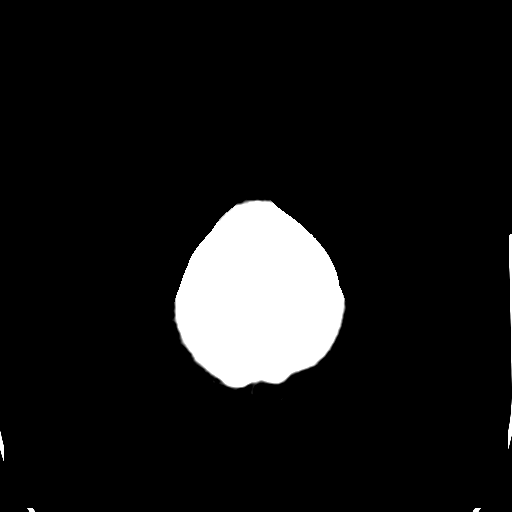

[Series 4: coronal soft tissue · coronal · 0.31mm/px · 3 of 67 slices shown]
[im 23/67  brain]
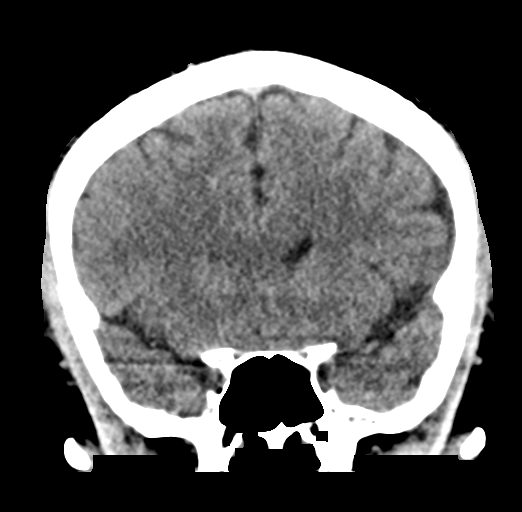
[im 30/67  brain]
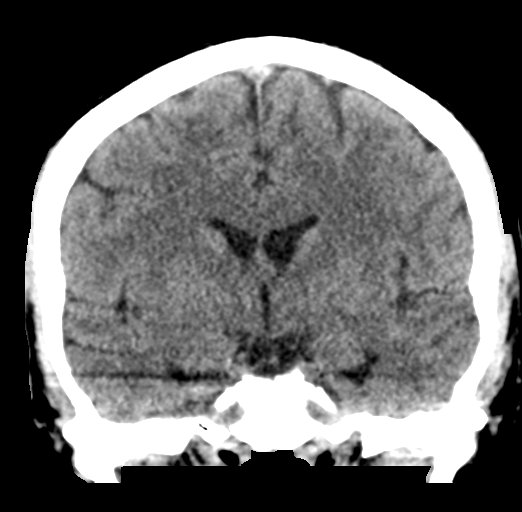
[im 37/67  brain]
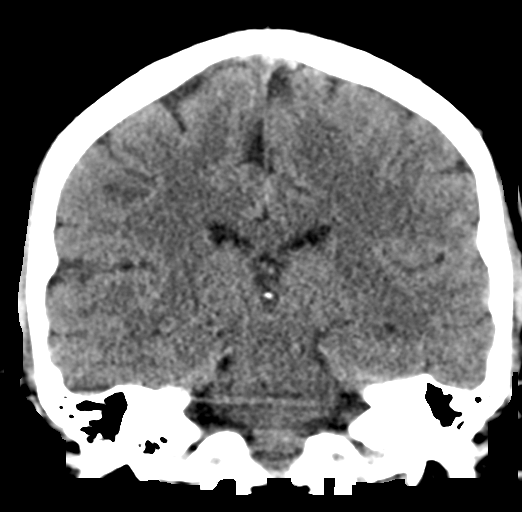

[Series 5: sagittal soft tissue · sagittal · 0.34mm/px · 3 of 56 slices shown]
[im 19/56  brain]
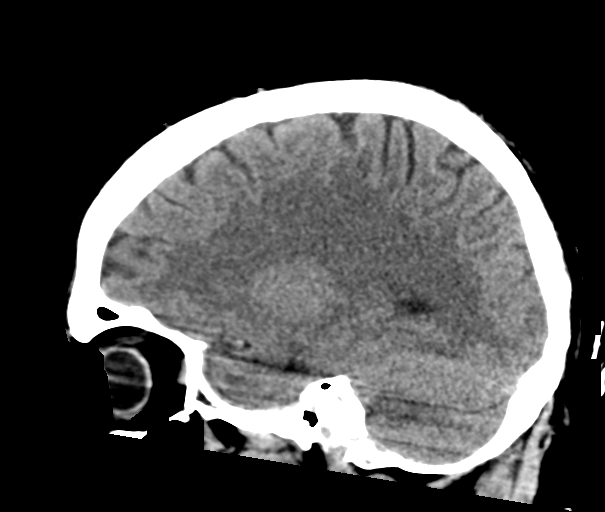
[im 28/56  brain]
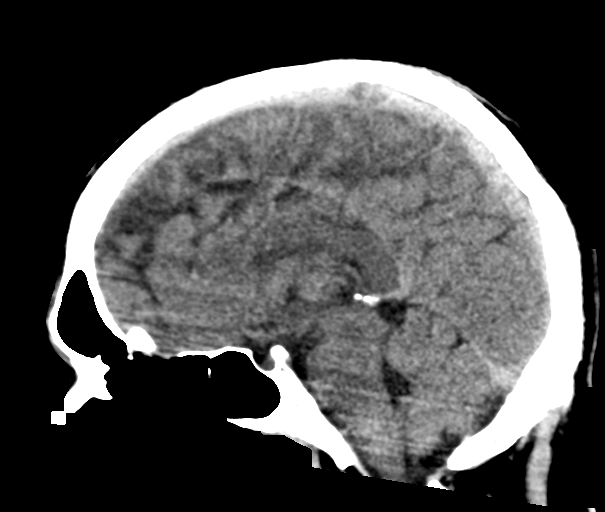
[im 37/56  brain]
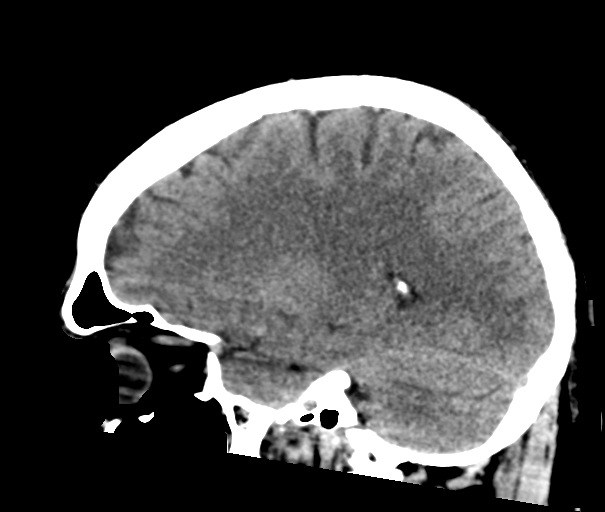

[16 of 47 positions shown; findings below may reference images not displayed]

FINDINGS: Brain: No mass lesion, intraparenchymal hemorrhage or extra-axial
collection. No evidence of acute cortical infarct. Brain parenchyma
and CSF-containing spaces are normal for age.

Vascular: No hyperdense vessel or unexpected calcification.

Skull: Normal visualized skull base, calvarium and extracranial soft
tissues.

Sinuses/Orbits: No sinus fluid levels or advanced mucosal
thickening. No mastoid effusion. Normal orbits.
IMPRESSION: Normal head CT.

## 2019-01-23 ENCOUNTER — Other Ambulatory Visit: Payer: Self-pay

## 2019-01-24 ENCOUNTER — Encounter: Payer: Self-pay | Admitting: Family

## 2019-01-24 ENCOUNTER — Ambulatory Visit (INDEPENDENT_AMBULATORY_CARE_PROVIDER_SITE_OTHER): Payer: Medicaid Other | Admitting: Family

## 2019-01-24 VITALS — BP 126/71 | HR 92 | Temp 98.6°F | Ht 65.0 in | Wt 172.5 lb

## 2019-01-24 DIAGNOSIS — L600 Ingrowing nail: Secondary | ICD-10-CM

## 2019-01-24 DIAGNOSIS — L03032 Cellulitis of left toe: Secondary | ICD-10-CM

## 2019-01-24 MED ORDER — CEPHALEXIN 500 MG PO CAPS: 500.0000 mg | ORAL_CAPSULE | Freq: Three times a day (TID) | ORAL | 0 refills | Status: DC

## 2019-01-24 NOTE — Patient Instructions (Signed)
Ingrown Toenail An ingrown toenail occurs when the corner or sides of a toenail grow into the surrounding skin. This causes discomfort and pain. The big toe is most commonly affected, but any of the toes can be affected. If an ingrown toenail is not treated, it can become infected. What are the causes? This condition may be caused by:  Wearing shoes that are too small or tight.  An injury, such as stubbing your toe or having your toe stepped on.  Improper cutting or care of your toenails.  Having nail or foot abnormalities that were present from birth (congenital abnormalities), such as having a nail that is too big for your toe. What increases the risk? The following factors may make you more likely to develop ingrown toenails:  Age. Nails tend to get thicker with age, so ingrown nails are more common among older people.  Cutting your toenails incorrectly, such as cutting them very short or cutting them unevenly. An ingrown toenail is more likely to get infected if you have:  Diabetes.  Blood flow (circulation) problems. What are the signs or symptoms? Symptoms of an ingrown toenail may include:  Pain, soreness, or tenderness.  Redness.  Swelling.  Hardening of the skin that surrounds the toenail. Signs that an ingrown toenail may be infected include:  Fluid or pus.  Symptoms that get worse instead of better. How is this diagnosed? An ingrown toenail may be diagnosed based on your medical history, your symptoms, and a physical exam. If you have fluid or blood coming from your toenail, a sample may be collected to test for the specific type of bacteria that is causing the infection. How is this treated? Treatment depends on how severe your ingrown toenail is. You may be able to care for your toenail at home.  If you have an infection, you may be prescribed antibiotic medicines.  If you have fluid or pus draining from your toenail, your health care provider may drain  it.  If you have trouble walking, you may be given crutches to use.  If you have a severe or infected ingrown toenail, you may need a procedure to remove part or all of the nail. Follow these instructions at home: Foot care   Do not pick at your toenail or try to remove it yourself.  Soak your foot in warm, soapy water. Do this for 20 minutes, 3 times a day, or as often as told by your health care provider. This helps to keep your toe clean and keep your skin soft.  Wear shoes that fit well and are not too tight. Your health care provider may recommend that you wear open-toed shoes while you heal.  Trim your toenails regularly and carefully. Cut your toenails straight across to prevent injury to the skin at the corners of the toenail. Do not cut your nails in a curved shape.  Keep your feet clean and dry to help prevent infection. Medicines  Take over-the-counter and prescription medicines only as told by your health care provider.  If you were prescribed an antibiotic, take it as told by your health care provider. Do not stop taking the antibiotic even if you start to feel better. Activity  Return to your normal activities as told by your health care provider. Ask your health care provider what activities are safe for you.  Avoid activities that cause pain. General instructions  If your health care provider told you to use crutches to help you move around, use them   as instructed.  Keep all follow-up visits as told by your health care provider. This is important. Contact a health care provider if:  You have more redness, swelling, pain, or other symptoms that do not improve with treatment.  You have fluid, blood, or pus coming from your toenail. Get help right away if:  You have a red streak on your skin that starts at your foot and spreads up your leg.  You have a fever. Summary  An ingrown toenail occurs when the corner or sides of a toenail grow into the surrounding  skin. This causes discomfort and pain. The big toe is most commonly affected, but any of the toes can be affected.  If an ingrown toenail is not treated, it can become infected.  Fluid or pus draining from your toenail is a sign of infection. Your health care provider may need to drain it. You may be given antibiotics to treat the infection.  Trimming your toenails regularly and properly can help you prevent an ingrown toenail. This information is not intended to replace advice given to you by your health care provider. Make sure you discuss any questions you have with your health care provider. Document Revised: 04/12/2018 Document Reviewed: 09/06/2016 Elsevier Patient Education  2020 [employer]  

## 2019-01-24 NOTE — Progress Notes (Signed)
Subjective:    Patient ID: Jeremy Dunn, male    DOB: 1980-03-22, 39 y.o.   MRN: [id number]  Chief Complaint  Patient presents with  . Ingrown Toenail    pt here today c/o what he thinks is an ingrown toenail on the left big toe    HPI Pt presents to the office today with swelling and pain in left great toe pain that started over a month ago. He has had an ingrown toenail in the past. He reports constant achingsharp pain of 10 out 10 when touching.  He has soaked the foot with mild relief.    Review of Systems  Skin:       Left lateral toe pain  All other systems reviewed and are negative.      Objective:   Physical Exam Vitals reviewed.  Constitutional:      General: He is not in acute distress.    Appearance: He is well-developed.  HENT:     Head: Normocephalic.     Right Ear: Tympanic membrane normal.     Left Ear: Tympanic membrane normal.  Eyes:     General:        Right eye: No discharge.        Left eye: No discharge.     Pupils: Pupils are equal, round, and reactive to light.  Neck:     Thyroid: No thyromegaly.  Cardiovascular:     Rate and Rhythm: Normal rate and regular rhythm.     Heart sounds: Normal heart sounds. No murmur.  Pulmonary:     Effort: Pulmonary effort is normal. No respiratory distress.     Breath sounds: Normal breath sounds. No wheezing.  Abdominal:     General: Bowel sounds are normal. There is no distension.     Palpations: Abdomen is soft.     Tenderness: There is no abdominal tenderness.  Musculoskeletal:        General: No tenderness. Normal range of motion.     Cervical back: Normal range of motion and neck supple.  Skin:    General: Skin is warm and dry.     Findings: No erythema or rash.     Comments: Left great medial toe swelling, tenderness, purulent discharge present  Neurological:     Mental Status: He is alert and oriented to person, place, and time.     Cranial Nerves: No cranial nerve deficit.     Deep  Tendon Reflexes: Reflexes are normal and symmetric.  Psychiatric:        Behavior: Behavior normal.        Thought Content: Thought content normal.        Judgment: Judgment normal.      BP 126/71   Pulse 92   Temp 98.6 F (37 C) (Temporal)   Ht 5\' 5"  (1.651 m)   Wt 172 lb 8 oz (78.2 kg)   BMI 28.71 kg/m       Assessment & Plan:  Jeremy Dunn comes in today with chief complaint of Ingrown Toenail (pt here today c/o what he thinks is an ingrown toenail on the left big toe)   Diagnosis and orders addressed:  1. Ingrowing nail, left great toe - cephALEXin (KEFLEX) 500 MG capsule; Take 1 capsule (500 mg total) by mouth 3 (three) times daily.  Dispense: 21 capsule; Refill: 0 - Ambulatory referral to Podiatry  2. Paronychia of great toe of left foot  Continue to soak foot Keep clean and  dry Avoid squeezing or picky Tylenol as needed Follow up with Podiatry    Jannifer Rodney, FNP

## 2019-02-07 ENCOUNTER — Ambulatory Visit: Payer: Medicaid Other | Admitting: Podiatry

## 2019-02-07 ENCOUNTER — Other Ambulatory Visit: Payer: Self-pay

## 2019-02-07 DIAGNOSIS — L600 Ingrowing nail: Secondary | ICD-10-CM | POA: Diagnosis not present

## 2019-02-07 DIAGNOSIS — M79675 Pain in left toe(s): Secondary | ICD-10-CM

## 2019-02-11 ENCOUNTER — Encounter: Payer: Self-pay | Admitting: Podiatry

## 2019-02-11 NOTE — Progress Notes (Signed)
  Subjective:  Patient ID: Jeremy Dunn, male    DOB: 01-25-1980,  MRN: [id number]  Chief Complaint  Patient presents with  . Nail Problem    pt is her for an ingrown toenail on the left big toenail medial side, which has been going on for about 3 months, pt also states that pain is elevated to the touch. pt has tried debridement, but not much has helped    39 y.o. male presents with the above complaint.  Patient presents with ingrown to the left hallux big/great toe the medial border.  Patient states been hurting him for 3 months has progressively gotten worse.  Patient has tried self debridement which has not helped.  Patient has been on antibiotics which has not helped either is painful to touch there is some swelling and some oozing associated with it patient states that there is some tingling sensation as well he would like to know if this could be removed.   Review of Systems: Negative except as noted in the HPI. Denies N/V/F/Ch.  Past Medical History:  Diagnosis Date  . Renal disorder    renal stones  . Seizures (HCC)     Current Outpatient Medications:  .  amLODipine (NORVASC) 5 MG tablet, TAKE 1 TABLET BY MOUTH EVERY DAY, Disp: 30 tablet, Rfl: 0 .  cephALEXin (KEFLEX) 500 MG capsule, Take 1 capsule (500 mg total) by mouth 3 (three) times daily., Disp: 21 capsule, Rfl: 0  Social History   Tobacco Use  Smoking Status Former Smoker  . Packs/day: 1.00  . Types: Cigarettes  Smokeless Tobacco Never Used    Allergies  Allergen Reactions  . Sulfa Antibiotics    Objective:  There were no vitals filed for this visit. There is no height or weight on file to calculate BMI. Constitutional Well developed. Well nourished.  Vascular Dorsalis pedis pulses palpable bilaterally. Posterior tibial pulses palpable bilaterally. Capillary refill normal to all digits.  No cyanosis or clubbing noted. Pedal hair growth normal.  Neurologic Normal speech. Oriented to person, place,  and time. Epicritic sensation to light touch grossly present bilaterally.  Dermatologic Painful ingrowing nail at medial nail borders of the hallux nail left. No other open wounds. No skin lesions.  Orthopedic: Normal joint ROM without pain or crepitus bilaterally. No visible deformities. No bony tenderness.   Radiographs: None Assessment:  No diagnosis found. Plan:  Patient was evaluated and treated and all questions answered.  Ingrown Nail, left -Patient elects to proceed with minor surgery to remove ingrown toenail removal today. Consent reviewed and signed by patient. -Ingrown nail excised. See procedure note. -Educated on post-procedure care including soaking. Written instructions provided and reviewed. -Patient to follow up in 2 weeks for nail check.  Procedure: Excision of Ingrown Toenail Location: Left 1st toe medial nail borders. Anesthesia: Lidocaine 1% plain; 1.5 mL and Marcaine 0.5% plain; 1.5 mL, digital block. Skin Prep: Betadine. Dressing: Silvadene; telfa; dry, sterile, compression dressing. Technique: Following skin prep, the toe was exsanguinated and a tourniquet was secured at the base of the toe. The affected nail border was freed, split with a nail splitter, and excised. Chemical matrixectomy was then performed with phenol and irrigated out with alcohol. The tourniquet was then removed and sterile dressing applied. Disposition: Patient tolerated procedure well. Patient to return in 2 weeks for follow-up.   No follow-ups on file.

## 2019-02-12 ENCOUNTER — Ambulatory Visit (INDEPENDENT_AMBULATORY_CARE_PROVIDER_SITE_OTHER): Payer: Medicaid Other | Admitting: Family

## 2019-02-12 ENCOUNTER — Encounter: Payer: Self-pay | Admitting: Family

## 2019-02-12 DIAGNOSIS — I10 Essential (primary) hypertension: Secondary | ICD-10-CM | POA: Diagnosis not present

## 2019-02-12 DIAGNOSIS — F411 Generalized anxiety disorder: Secondary | ICD-10-CM

## 2019-02-12 MED ORDER — AMLODIPINE BESYLATE 5 MG PO TABS: 5.0000 mg | ORAL_TABLET | Freq: Every day | ORAL | 2 refills | Status: DC

## 2019-02-12 NOTE — Progress Notes (Signed)
   Virtual Visit via telephone Note Due to COVID-19 pandemic this visit was conducted virtually. This visit type was conducted due to national recommendations for restrictions regarding the COVID-19 Pandemic (e.g. social distancing, sheltering in place) in an effort to limit this patient's exposure and mitigate transmission in our community. All issues noted in this document were discussed and addressed.  A physical exam was not performed with this format.  I connected with Jeremy Dunn on 02/12/19 at 11:22 AM by telephone and verified that I am speaking with the correct person using two identifiers. Jeremy Dunn is currently located at home and wife  is currently with him during visit. The provider, [name], FNP is located in their office at time of visit.  I discussed the limitations, risks, security and privacy concerns of performing an evaluation and management service by telephone and the availability of in person appointments. I also discussed with the patient that there may be a patient responsible charge related to this service. The patient expressed understanding and agreed to proceed.   History and Present Illness:  PT calls the office today for refill of Norvasc. Pt reports he has been out of his medications and his BP has been elevated because of this.  Hypertension This is a chronic problem. The current episode started more than 1 year ago. The problem has been resolved since onset. The problem is controlled. Associated symptoms include anxiety. Pertinent negatives include no malaise/fatigue, peripheral edema or shortness of breath. Risk factors for coronary artery disease include obesity, male gender and sedentary lifestyle. Past treatments include calcium channel blockers. The current treatment provides mild improvement. There is no history of kidney disease, CVA or heart failure.  Anxiety Presents for follow-up visit. Symptoms include depressed mood, excessive worry,  irritability, panic and restlessness. Patient reports no shortness of breath. Symptoms occur occasionally. The severity of symptoms is moderate. The quality of sleep is good.        Review of Systems  Constitutional: Positive for irritability. Negative for malaise/fatigue.  Respiratory: Negative for shortness of breath.   All other systems reviewed and are negative.    Observations/Objective: No SOB or distress noted   Assessment and Plan: 1. Benign essential hypertension -Dash diet information given -Exercise encouraged - Stress Management  -Continue current meds -RTO in 1 month to recheck HNT - amLODipine (NORVASC) 5 MG tablet; Take 1 tablet (5 mg total) by mouth daily.  Dispense: 90 tablet; Refill: 2 - CMP14+EGFR; Future  2. GAD (generalized anxiety disorder) - CMP14+EGFR; Future     I discussed the assessment and treatment plan with the patient. The patient was provided an opportunity to ask questions and all were answered. The patient agreed with the plan and demonstrated an understanding of the instructions.   The patient was advised to call back or seek an in-person evaluation if the symptoms worsen or if the condition fails to improve as anticipated.  The above assessment and management plan was discussed with the patient. The patient verbalized understanding of and has agreed to the management plan. Patient is aware to call the clinic if symptoms persist or worsen. Patient is aware when to return to the clinic for a follow-up visit. Patient educated on when it is appropriate to go to the emergency department.   Time call ended:  11: 31 AM  I provided 9 minutes of non-face-to-face time during this encounter.    [name], FNP

## 2019-11-11 ENCOUNTER — Other Ambulatory Visit: Payer: Self-pay

## 2019-11-11 ENCOUNTER — Other Ambulatory Visit: Payer: Medicaid Other

## 2019-11-11 DIAGNOSIS — Z20822 Contact with and (suspected) exposure to covid-19: Secondary | ICD-10-CM | POA: Diagnosis not present

## 2019-11-12 LAB — NOVEL CORONAVIRUS, NAA: SARS-CoV-2, NAA: NOT DETECTED

## 2019-11-12 LAB — SPECIMEN STATUS REPORT

## 2019-11-12 LAB — SARS-COV-2, NAA 2 DAY TAT

## 2019-12-09 ENCOUNTER — Telehealth: Payer: Self-pay

## 2019-12-09 DIAGNOSIS — I10 Essential (primary) hypertension: Secondary | ICD-10-CM

## 2019-12-09 MED ORDER — AMLODIPINE BESYLATE 5 MG PO TABS: 5.0000 mg | ORAL_TABLET | Freq: Every day | ORAL | 0 refills | Status: DC

## 2019-12-09 NOTE — Telephone Encounter (Signed)
  Prescription Request  12/09/2019  What is the name of the medication or equipment? Amlodipine  Have you contacted your pharmacy to request a refill? (if applicable) yes  Which pharmacy would you like this sent to? [facility]   Patient notified that their request is being sent to the clinical staff for review and that they should receive a response within 2 business days.   Jeremy Dunn' pt.  He made appt in Jan. & I told him to keep appt then.  He wants one month supply until then. He only has 2 pills left.

## 2019-12-09 NOTE — Telephone Encounter (Signed)
Aware refill sent to pharmacy ?

## 2020-01-13 DIAGNOSIS — H5213 Myopia, bilateral: Secondary | ICD-10-CM | POA: Diagnosis not present

## 2020-01-15 ENCOUNTER — Other Ambulatory Visit: Payer: Self-pay

## 2020-01-15 ENCOUNTER — Ambulatory Visit: Payer: Medicaid Other | Admitting: Family

## 2020-01-15 ENCOUNTER — Encounter: Payer: Self-pay | Admitting: Family

## 2020-01-15 VITALS — BP 116/80 | HR 80 | Temp 98.8°F | Ht 65.0 in | Wt 179.4 lb

## 2020-01-15 DIAGNOSIS — Z7189 Other specified counseling: Secondary | ICD-10-CM

## 2020-01-15 DIAGNOSIS — I10 Essential (primary) hypertension: Secondary | ICD-10-CM | POA: Diagnosis not present

## 2020-01-15 DIAGNOSIS — Z0000 Encounter for general adult medical examination without abnormal findings: Secondary | ICD-10-CM

## 2020-01-15 DIAGNOSIS — Z0001 Encounter for general adult medical examination with abnormal findings: Secondary | ICD-10-CM

## 2020-01-15 DIAGNOSIS — Z23 Encounter for immunization: Secondary | ICD-10-CM

## 2020-01-15 DIAGNOSIS — Z1159 Encounter for screening for other viral diseases: Secondary | ICD-10-CM

## 2020-01-15 MED ORDER — AMLODIPINE BESYLATE 5 MG PO TABS: 5.0000 mg | ORAL_TABLET | Freq: Every day | ORAL | 1 refills | Status: DC

## 2020-01-15 NOTE — Patient Instructions (Signed)

## 2020-01-15 NOTE — Progress Notes (Signed)
Subjective:    Patient ID: Jeremy Dunn, male    DOB: 12-20-1980, 40 y.o.   MRN: [id number]  Chief Complaint  Patient presents with  . Hypertension   PT presents to the office today for CPE.  Hypertension This is a chronic problem. The current episode started more than 1 year ago. The problem has been resolved since onset. The problem is controlled. Pertinent negatives include no malaise/fatigue, peripheral edema or shortness of breath. Risk factors for coronary artery disease include obesity and male gender. The current treatment provides moderate improvement.      Review of Systems  Constitutional: Negative for malaise/fatigue.  Respiratory: Negative for shortness of breath.   All other systems reviewed and are negative.  Family History  Problem Relation Age of Onset  . Hypertension Mother   . Cancer Father    Social History   Socioeconomic History  . Marital status: Married    Spouse name: Not on file  . Number of children: Not on file  . Years of education: Not on file  . Highest education level: Not on file  Occupational History  . Not on file  Tobacco Use  . Smoking status: Former Smoker    Packs/day: 1.00    Types: Cigarettes  . Smokeless tobacco: Never Used  Vaping Use  . Vaping Use: Never used  Substance and Sexual Activity  . Alcohol use: Not Currently  . Drug use: No  . Sexual activity: Yes  Other Topics Concern  . Not on file  Social History Narrative  . Not on file   Social Determinants of Health   Financial Resource Strain: Not on file  Food Insecurity: Not on file  Transportation Needs: Not on file  Physical Activity: Not on file  Stress: Not on file  Social Connections: Not on file       Objective:   Physical Exam Vitals reviewed.  Constitutional:      General: He is not in acute distress.    Appearance: He is well-developed and well-nourished.  HENT:     Head: Normocephalic.     Right Ear: Tympanic membrane normal.     Left  Ear: Tympanic membrane normal.     Mouth/Throat:     Mouth: Oropharynx is clear and moist.  Eyes:     General:        Right eye: No discharge.        Left eye: No discharge.     Pupils: Pupils are equal, round, and reactive to light.  Neck:     Thyroid: No thyromegaly.  Cardiovascular:     Rate and Rhythm: Normal rate and regular rhythm.     Pulses: Intact distal pulses.     Heart sounds: Normal heart sounds. No murmur heard.   Pulmonary:     Effort: Pulmonary effort is normal. No respiratory distress.     Breath sounds: Normal breath sounds. No wheezing.  Abdominal:     General: Bowel sounds are normal. There is no distension.     Palpations: Abdomen is soft.     Tenderness: There is no abdominal tenderness.  Musculoskeletal:        General: No tenderness or edema. Normal range of motion.     Cervical back: Normal range of motion and neck supple.  Skin:    General: Skin is warm and dry.     Findings: No erythema or rash.  Neurological:     Mental Status: He is alert and oriented  to person, place, and time.     Cranial Nerves: No cranial nerve deficit.     Deep Tendon Reflexes: Reflexes are normal and symmetric.  Psychiatric:        Mood and Affect: Mood and affect normal.        Behavior: Behavior normal.        Thought Content: Thought content normal.        Judgment: Judgment normal.          BP 116/80   Pulse 80   Temp 98.8 F (37.1 C) (Temporal)   Ht '5\' 5"'  (1.651 m)   Wt 179 lb 6.4 oz (81.4 kg)   BMI 29.85 kg/m   Assessment & Plan:  Jeremy Dunn comes in today with chief complaint of Hypertension   Diagnosis and orders addressed:  1. Benign essential hypertension - amLODipine (NORVASC) 5 MG tablet; Take 1 tablet (5 mg total) by mouth daily.  Dispense: 90 tablet; Refill: 1 - CMP14+EGFR  2. Annual physical exam - CMP14+EGFR - CBC with Differential/Platelet - Lipid panel - TSH - PSA, total and free - Hepatitis C antibody  3. Need for  hepatitis C screening test - Hepatitis C antibody  4. Educated about COVID-19 virus infection - Discussed risks of COVID. PT still does not want to get COVID vaccine.   Labs pending Health Maintenance reviewed Diet and exercise encouraged  Follow up plan: 6 months   Jeremy Dun, FNP

## 2020-01-16 LAB — CBC WITH DIFFERENTIAL/PLATELET
Basophils Absolute: 0.1 10*3/uL (ref 0.0–0.2)
Basos: 2 %
EOS (ABSOLUTE): 0.2 10*3/uL (ref 0.0–0.4)
Eos: 3 %
Hematocrit: 49.6 % (ref 37.5–51.0)
Hemoglobin: 17.2 g/dL (ref 13.0–17.7)
Immature Grans (Abs): 0 10*3/uL (ref 0.0–0.1)
Immature Granulocytes: 0 %
Lymphocytes Absolute: 1.7 10*3/uL (ref 0.7–3.1)
Lymphs: 27 %
MCH: 28.8 pg (ref 26.6–33.0)
MCHC: 34.7 g/dL (ref 31.5–35.7)
MCV: 83 fL (ref 79–97)
Monocytes Absolute: 0.6 10*3/uL (ref 0.1–0.9)
Monocytes: 9 %
Neutrophils Absolute: 3.7 10*3/uL (ref 1.4–7.0)
Neutrophils: 59 %
Platelets: 294 10*3/uL (ref 150–450)
RBC: 5.98 x10E6/uL — ABNORMAL HIGH (ref 4.14–5.80)
RDW: 12.9 % (ref 11.6–15.4)
WBC: 6.2 10*3/uL (ref 3.4–10.8)

## 2020-01-16 LAB — CMP14+EGFR
ALT: 27 IU/L (ref 0–44)
AST: 17 IU/L (ref 0–40)
Albumin/Globulin Ratio: 2.2 (ref 1.2–2.2)
Albumin: 4.8 g/dL (ref 4.0–5.0)
Alkaline Phosphatase: 107 IU/L (ref 44–121)
BUN/Creatinine Ratio: 6 — ABNORMAL LOW (ref 9–20)
BUN: 6 mg/dL (ref 6–20)
Bilirubin Total: 0.6 mg/dL (ref 0.0–1.2)
CO2: 28 mmol/L (ref 20–29)
Calcium: 9.6 mg/dL (ref 8.7–10.2)
Chloride: 103 mmol/L (ref 96–106)
Creatinine, Ser: 0.96 mg/dL (ref 0.76–1.27)
GFR calc Af Amer: 115 mL/min/{1.73_m2} (ref >59)
GFR calc non Af Amer: 99 mL/min/{1.73_m2} (ref >59)
Globulin, Total: 2.2 g/dL (ref 1.5–4.5)
Glucose: 93 mg/dL (ref 65–99)
Potassium: 5.1 mmol/L (ref 3.5–5.2)
Sodium: 144 mmol/L (ref 134–144)
Total Protein: 7 g/dL (ref 6.0–8.5)

## 2020-01-16 LAB — LIPID PANEL
Chol/HDL Ratio: 5.5 ratio — ABNORMAL HIGH (ref 0.0–5.0)
Cholesterol, Total: 208 mg/dL — ABNORMAL HIGH (ref 100–199)
HDL: 38 mg/dL — ABNORMAL LOW (ref >39)
LDL Chol Calc (NIH): 147 mg/dL — ABNORMAL HIGH (ref 0–99)
Triglycerides: 125 mg/dL (ref 0–149)
VLDL Cholesterol Cal: 23 mg/dL (ref 5–40)

## 2020-01-16 LAB — PSA, TOTAL AND FREE
PSA, Free Pct: 40 %
PSA, Free: 0.2 ng/mL
Prostate Specific Ag, Serum: 0.5 ng/mL (ref 0.0–4.0)

## 2020-01-16 LAB — TSH: TSH: 1.33 u[IU]/mL (ref 0.450–4.500)

## 2020-01-16 LAB — HEPATITIS C ANTIBODY: Hep C Virus Ab: <0.1 s/co ratio (ref 0.0–0.9)

## 2020-01-21 DIAGNOSIS — R000 Tachycardia, unspecified: Secondary | ICD-10-CM | POA: Diagnosis not present

## 2020-01-21 DIAGNOSIS — R457 State of emotional shock and stress, unspecified: Secondary | ICD-10-CM | POA: Diagnosis not present

## 2020-01-21 DIAGNOSIS — R0689 Other abnormalities of breathing: Secondary | ICD-10-CM | POA: Diagnosis not present

## 2020-01-21 DIAGNOSIS — I10 Essential (primary) hypertension: Secondary | ICD-10-CM | POA: Diagnosis not present

## 2020-01-21 DIAGNOSIS — R450 Nervousness: Secondary | ICD-10-CM | POA: Diagnosis not present

## 2020-01-22 ENCOUNTER — Emergency Department (HOSPITAL_COMMUNITY): Payer: Medicaid Other

## 2020-01-22 ENCOUNTER — Emergency Department (HOSPITAL_COMMUNITY)
Admission: EM | Admit: 2020-01-22 | Discharge: 2020-01-22 | Disposition: A | Discharge status: Home / Self Care | Payer: Medicaid Other | Attending: Emergency Medicine | Admitting: Emergency Medicine

## 2020-01-22 ENCOUNTER — Encounter (HOSPITAL_COMMUNITY): Payer: Self-pay | Admitting: *Deleted

## 2020-01-22 ENCOUNTER — Other Ambulatory Visit: Payer: Self-pay

## 2020-01-22 DIAGNOSIS — R079 Chest pain, unspecified: Secondary | ICD-10-CM | POA: Diagnosis not present

## 2020-01-22 DIAGNOSIS — Z5321 Procedure and treatment not carried out due to patient leaving prior to being seen by health care provider: Secondary | ICD-10-CM | POA: Diagnosis not present

## 2020-01-22 LAB — CBC
HCT: 50.1 % (ref 39.0–52.0)
Hemoglobin: 16.6 g/dL (ref 13.0–17.0)
MCH: 28.7 pg (ref 26.0–34.0)
MCHC: 33.1 g/dL (ref 30.0–36.0)
MCV: 86.7 fL (ref 80.0–100.0)
Platelets: 300 10*3/uL (ref 150–400)
RBC: 5.78 MIL/uL (ref 4.22–5.81)
RDW: 12.9 % (ref 11.5–15.5)
WBC: 9.3 10*3/uL (ref 4.0–10.5)
nRBC: 0 % (ref 0.0–0.2)

## 2020-01-22 LAB — BASIC METABOLIC PANEL
Anion gap: 9 (ref 5–15)
BUN: 11 mg/dL (ref 6–20)
CO2: 25 mmol/L (ref 22–32)
Calcium: 9.3 mg/dL (ref 8.9–10.3)
Chloride: 107 mmol/L (ref 98–111)
Creatinine, Ser: 0.92 mg/dL (ref 0.61–1.24)
GFR, Estimated: >60 mL/min (ref >60)
Glucose, Bld: 113 mg/dL — ABNORMAL HIGH (ref 70–99)
Potassium: 4 mmol/L (ref 3.5–5.1)
Sodium: 141 mmol/L (ref 135–145)

## 2020-01-22 LAB — TROPONIN I (HIGH SENSITIVITY): Troponin I (High Sensitivity): 5 ng/L (ref <18)

## 2020-01-22 LAB — GLUCOSE, CAPILLARY: Glucose-Capillary: 124 mg/dL — ABNORMAL HIGH (ref 70–99)

## 2020-01-22 NOTE — ED Notes (Signed)
Left at 1530

## 2020-01-22 NOTE — ED Triage Notes (Signed)
C/o chest pain onset yesterday and states EMS came out evaluated him and advised him his sugar was up.

## 2020-01-23 ENCOUNTER — Ambulatory Visit: Payer: Medicaid Other | Admitting: Family Medicine

## 2020-01-28 ENCOUNTER — Ambulatory Visit: Payer: Medicaid Other | Admitting: Family Medicine

## 2020-01-28 ENCOUNTER — Ambulatory Visit: Payer: Medicaid Other

## 2020-01-28 ENCOUNTER — Other Ambulatory Visit: Payer: Self-pay

## 2020-01-28 ENCOUNTER — Encounter: Payer: Self-pay | Admitting: Family Medicine

## 2020-01-28 VITALS — BP 136/81 | HR 85 | Temp 97.9°F | Ht 65.0 in | Wt 170.6 lb

## 2020-01-28 DIAGNOSIS — F419 Anxiety disorder, unspecified: Secondary | ICD-10-CM | POA: Diagnosis not present

## 2020-01-28 MED ORDER — HYDROXYZINE HCL 25 MG PO TABS: 25.0000 mg | ORAL_TABLET | Freq: Three times a day (TID) | ORAL | 2 refills | Status: DC | PRN

## 2020-01-28 NOTE — Patient Instructions (Signed)

## 2020-01-28 NOTE — Progress Notes (Signed)
Assessment & Plan:  1. Anxiety - Uncontrolled. Patient declined medication to take on a daily basis to treat his anxiety.  He prefers just to have something to take as needed.  Rx'd hydroxyzine. - hydrOXYzine (ATARAX/VISTARIL) 25 MG tablet; Take 1 tablet (25 mg total) by mouth 3 (three) times daily as needed for anxiety.  Dispense: 30 tablet; Refill: 2   Follow up plan: Return in about 2 weeks (around 02/11/2020) for anxiety.  Jeremy Boston, MSN, APRN, FNP-C [facility]  Subjective:   Patient ID: Jeremy Dunn, male    DOB: 08-30-1980, 40 y.o.   MRN: [id number]  HPI: Jeremy Dunn is a 40 y.o. male presenting on 01/28/2020 for Hospitalization Follow-up [name] last Thursday, Patient left because of wait, Patient has labs done and chest xray and EKG.  Chest pain that is not constant sometimes it burn and then he states sometimes it  a pain. Patient states it usually happens after he eats about 15 minutes later. Patient stopped caffeine ans sugar. )  Patient is accompanied by his wife who he is okay with being present.  Patient went to any pain ER 6 days ago with complaints of chest pain.  He did not stay to be seen but did have a normal chest x-ray, EKG, and lab work.  He is here today for a follow-up.  Patient reports his chest gets a heavy feeling, his arms get tingly, he feels numb on his cheeks, and he feels a burning in his throat.  This occurs anytime he "thinks about it".  This started last week when his blood sugar went up to 285.  He was previously drinking a lots of caffeine and sugar drinks.  For the past week he has not had any caffeine and very low consumption of sugar.  He reports he is afraid to go to sleep because of his blood sugar.  Patient has a history of anxiety and bipolar disorder.  He has been off his medication for at least the past 2 years.  He does not feel he was having trouble with his anxiety until this.   ROS: Negative unless  specifically indicated above in HPI.   Relevant past medical history reviewed and updated as indicated.   Allergies and medications reviewed and updated.   Current Outpatient Medications:  .  amLODipine (NORVASC) 5 MG tablet, Take 1 tablet (5 mg total) by mouth daily., Disp: 90 tablet, Rfl: 1  Allergies  Allergen Reactions  . Sulfa Antibiotics     Objective:   BP 136/81   Pulse 85   Temp 97.9 F (36.6 C) (Temporal)   Ht 5\' 5"  (1.651 m)   Wt 170 lb 9.6 oz (77.4 kg)   SpO2 98%   BMI 28.39 kg/m    Physical Exam Vitals reviewed.  Constitutional:      General: He is not in acute distress.    Appearance: Normal appearance. He is overweight. He is not ill-appearing, toxic-appearing or diaphoretic.  HENT:     Head: Normocephalic and atraumatic.  Eyes:     General: No scleral icterus.       Right eye: No discharge.        Left eye: No discharge.     Conjunctiva/sclera: Conjunctivae normal.  Cardiovascular:     Rate and Rhythm: Normal rate and regular rhythm.     Heart sounds: Normal heart sounds. No murmur heard. No friction rub. No gallop.   Pulmonary:  Effort: Pulmonary effort is normal. No respiratory distress.     Breath sounds: Normal breath sounds. No stridor. No wheezing, rhonchi or rales.  Musculoskeletal:        General: Normal range of motion.     Cervical back: Normal range of motion.  Skin:    General: Skin is warm and dry.  Neurological:     Mental Status: He is alert and oriented to person, place, and time. Mental status is at baseline.  Psychiatric:        Mood and Affect: Mood normal.        Behavior: Behavior normal.        Thought Content: Thought content normal.        Judgment: Judgment normal.

## 2020-02-11 ENCOUNTER — Ambulatory Visit (INDEPENDENT_AMBULATORY_CARE_PROVIDER_SITE_OTHER): Payer: Medicaid Other | Admitting: Family Medicine

## 2020-02-11 ENCOUNTER — Encounter: Payer: Self-pay | Admitting: Family Medicine

## 2020-02-11 DIAGNOSIS — F419 Anxiety disorder, unspecified: Secondary | ICD-10-CM | POA: Diagnosis not present

## 2020-02-11 NOTE — Progress Notes (Signed)
   Virtual Visit via Telephone Note  I connected with Jeremy Dunn on 02/11/20 at 8:06 AM by telephone and verified that I am speaking with the correct person using two identifiers. BURNETT SPRAY is currently located at home and his wife, mother-in-law, and kids are currently with him during this visit. The provider, Gwenlyn Fudge, FNP is located in their office at time of visit.  I discussed the limitations, risks, security and privacy concerns of performing an evaluation and management service by telephone and the availability of in person appointments. I also discussed with the patient that there may be a patient responsible charge related to this service. The patient expressed understanding and agreed to proceed.  Subjective: PCP: Junie Spencer, FNP  Chief Complaint  Patient presents with  . Anxiety   Patient is following up on his anxiety.  He was started on hydroxyzine as needed due to symptoms of heavy chest, arms getting tingling, numbness on the cheeks and a burning in the throat.  He reports he is doing much better.  He is only had one episode of those symptoms since his last visit.  He did take the hydroxyzine at that time and went to sleep as it was bedtime.  He does not feel he needs anything else at this time.   ROS: Per HPI  Current Outpatient Medications:  .  amLODipine (NORVASC) 5 MG tablet, Take 1 tablet (5 mg total) by mouth daily., Disp: 90 tablet, Rfl: 1 .  hydrOXYzine (ATARAX/VISTARIL) 25 MG tablet, Take 1 tablet (25 mg total) by mouth 3 (three) times daily as needed for anxiety., Disp: 30 tablet, Rfl: 2  Allergies  Allergen Reactions  . Sulfa Antibiotics    Past Medical History:  Diagnosis Date  . Renal disorder    renal stones  . Seizures (HCC)     Observations/Objective: A&O  No respiratory distress or wheezing audible over the phone Mood, judgement, and thought processes all WNL   Assessment and Plan: 1. Anxiety - Well controlled on  current regimen.    Follow Up Instructions:  I discussed the assessment and treatment plan with the patient. The patient was provided an opportunity to ask questions and all were answered. The patient agreed with the plan and demonstrated an understanding of the instructions.   The patient was advised to call back or seek an in-person evaluation if the symptoms worsen or if the condition fails to improve as anticipated.  The above assessment and management plan was discussed with the patient. The patient verbalized understanding of and has agreed to the management plan. Patient is aware to call the clinic if symptoms persist or worsen. Patient is aware when to return to the clinic for a follow-up visit. Patient educated on when it is appropriate to go to the emergency department.   Time call ended: 8:11 AM  I provided 5 minutes of non-face-to-face time during this encounter.  [name], MSN, APRN, FNP-C [facility] 02/11/20

## 2020-03-14 ENCOUNTER — Other Ambulatory Visit: Payer: Self-pay | Admitting: Family

## 2020-03-14 DIAGNOSIS — I10 Essential (primary) hypertension: Secondary | ICD-10-CM

## 2020-04-29 IMAGING — DX ABDOMEN - 1 VIEW
2 series · 2 of 2 positions shown · non-contrast
Comparison: 08/10/2016

CLINICAL DATA: 37-year-old male with abdominal pain

EXAM:
ABDOMEN - 1 VIEW

[abdomen kub (1 of 2)]
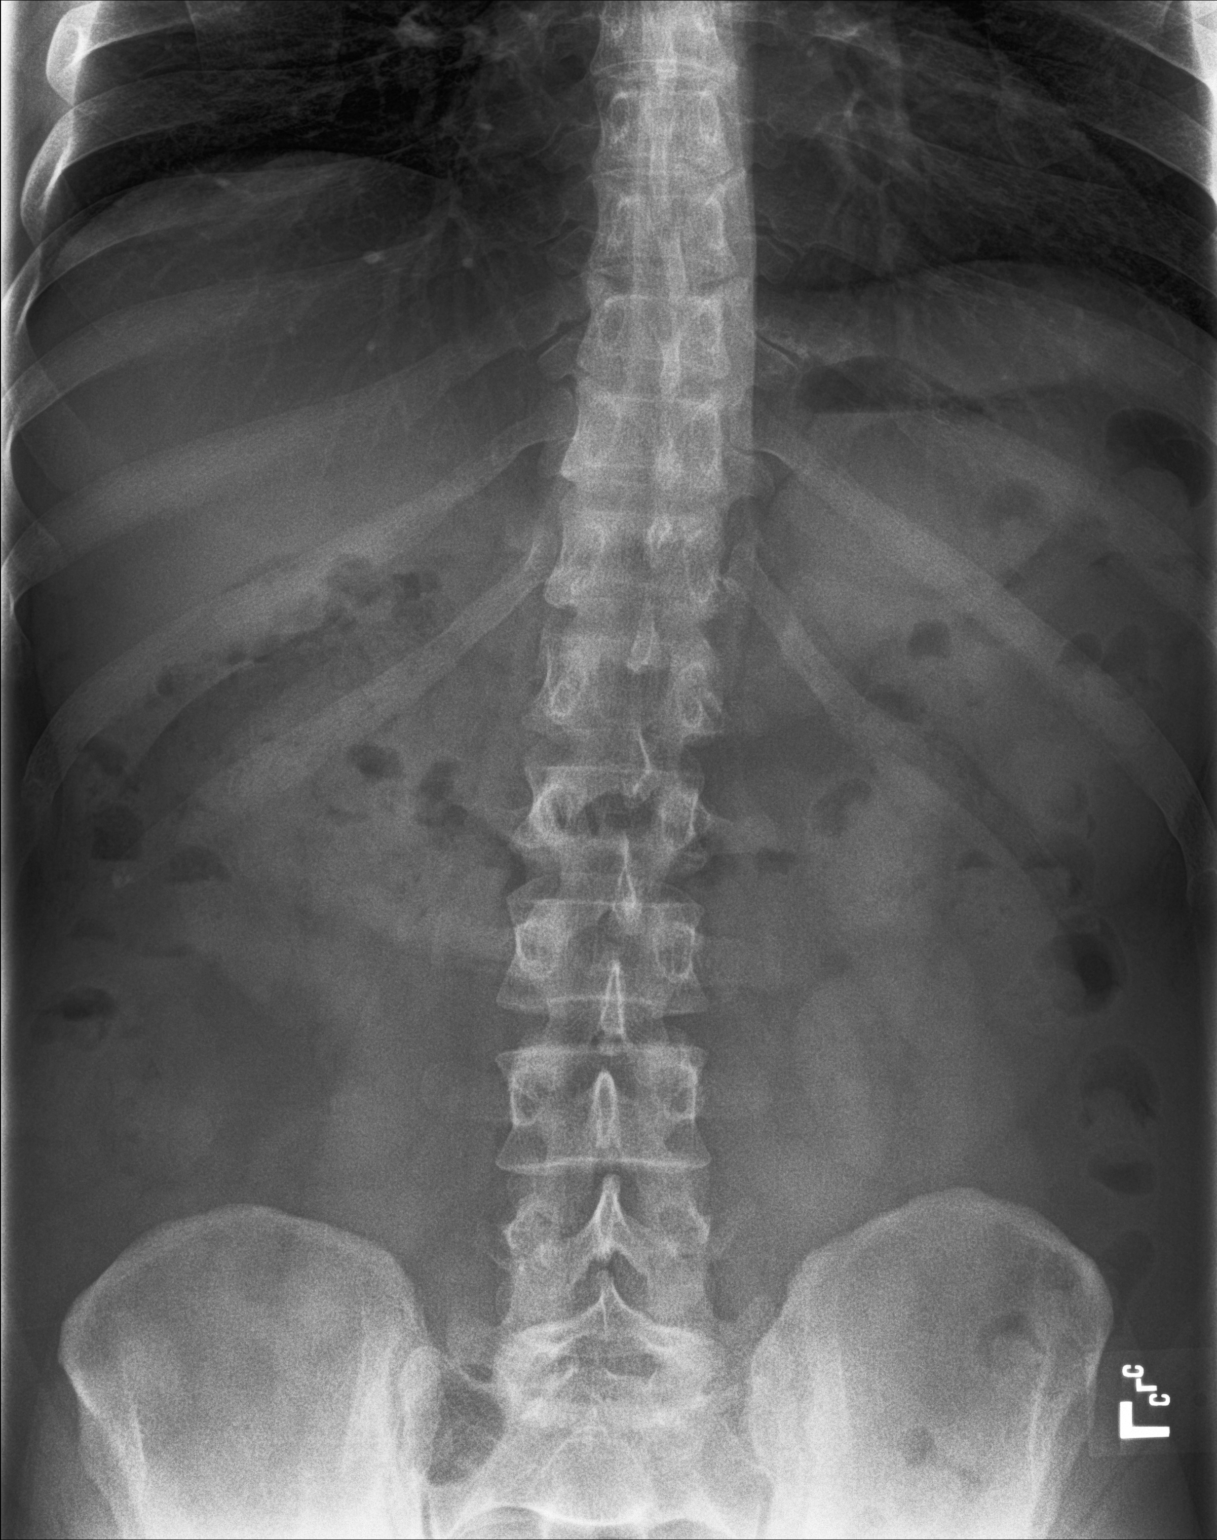

[abdomen kub (2 of 2)]
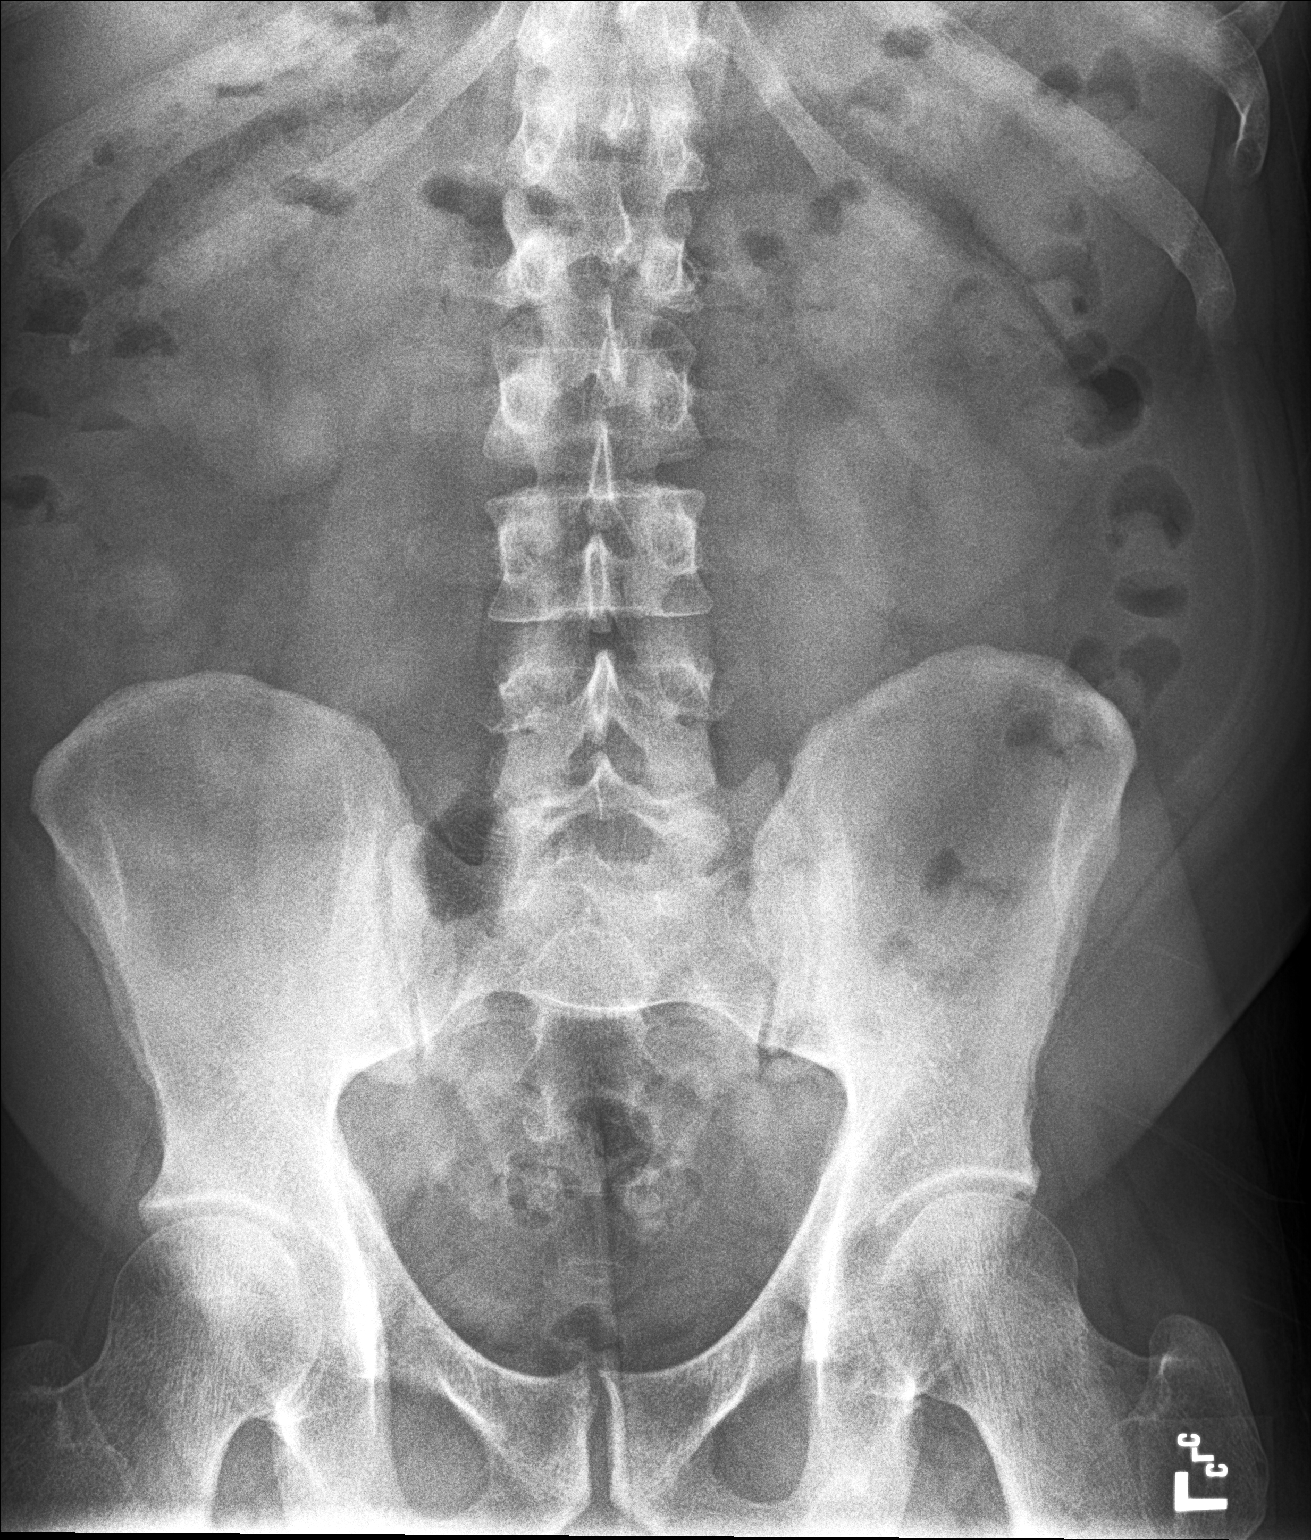

[2 of 2 positions shown; findings below may reference images not displayed]

FINDINGS: The bowel gas pattern is normal. No radio-opaque calculi or other
significant radiographic abnormality are seen.
IMPRESSION: Normal bowel gas pattern.

No plain film evidence of nephrolithiasis

## 2020-05-11 DIAGNOSIS — Z87891 Personal history of nicotine dependence: Secondary | ICD-10-CM | POA: Diagnosis not present

## 2020-05-11 DIAGNOSIS — I700 Atherosclerosis of aorta: Secondary | ICD-10-CM | POA: Diagnosis not present

## 2020-05-11 DIAGNOSIS — R0781 Pleurodynia: Secondary | ICD-10-CM | POA: Diagnosis not present

## 2020-05-11 DIAGNOSIS — R748 Abnormal levels of other serum enzymes: Secondary | ICD-10-CM | POA: Diagnosis not present

## 2020-05-11 DIAGNOSIS — I10 Essential (primary) hypertension: Secondary | ICD-10-CM | POA: Diagnosis not present

## 2020-05-11 DIAGNOSIS — R1011 Right upper quadrant pain: Secondary | ICD-10-CM | POA: Diagnosis not present

## 2020-05-12 ENCOUNTER — Telehealth: Payer: Self-pay | Admitting: *Deleted

## 2020-05-12 NOTE — Telephone Encounter (Signed)
Transition Care Management Follow-up Telephone Call  Date of discharge and from where: 05/11/2020 [facility] ED  How have you been since you were released from the hospital? "A little better"  Any questions or concerns? No  Items Reviewed:  Did the pt receive and understand the discharge instructions provided? Yes   Medications obtained and verified? Yes   Other? No   Any new allergies since your discharge? No   Dietary orders reviewed? No  Do you have support at home? Yes     Functional Questionnaire: (I = Independent and D = Dependent) ADLs: I  Bathing/Dressing- I  Meal Prep- I  Eating- I  Maintaining continence- I  Transferring/Ambulation- I  Managing Meds- I  Follow up appointments reviewed:   PCP Hospital f/u appt confirmed? No  - has routine appointment in July  Specialist Hospital f/u appt confirmed? N/A   Are transportation arrangements needed? No   If their condition worsens, is the pt aware to call PCP or go to the Emergency Dept.? Yes  Was the patient provided with contact information for the PCP's office or ED? Yes  Was to pt encouraged to call back with questions or concerns? Yes

## 2020-07-15 ENCOUNTER — Ambulatory Visit: Payer: Medicaid Other | Admitting: Family

## 2020-07-16 ENCOUNTER — Encounter: Payer: Self-pay | Admitting: Family

## 2020-08-31 ENCOUNTER — Other Ambulatory Visit: Payer: Self-pay

## 2020-08-31 ENCOUNTER — Ambulatory Visit: Payer: Medicaid Other | Admitting: Family

## 2020-08-31 ENCOUNTER — Encounter: Payer: Self-pay | Admitting: Family

## 2020-08-31 VITALS — BP 118/85 | HR 74 | Temp 97.5°F | Ht 65.0 in | Wt 164.8 lb

## 2020-08-31 DIAGNOSIS — F411 Generalized anxiety disorder: Secondary | ICD-10-CM

## 2020-08-31 DIAGNOSIS — F1011 Alcohol abuse, in remission: Secondary | ICD-10-CM | POA: Diagnosis not present

## 2020-08-31 DIAGNOSIS — I10 Essential (primary) hypertension: Secondary | ICD-10-CM | POA: Diagnosis not present

## 2020-08-31 DIAGNOSIS — F419 Anxiety disorder, unspecified: Secondary | ICD-10-CM

## 2020-08-31 LAB — CBC WITH DIFFERENTIAL/PLATELET
Basophils Absolute: 0.1 10*3/uL (ref 0.0–0.2)
Basos: 1 %
EOS (ABSOLUTE): 0.1 10*3/uL (ref 0.0–0.4)
Eos: 2 %
Hematocrit: 45.9 % (ref 37.5–51.0)
Hemoglobin: 16.2 g/dL (ref 13.0–17.7)
Immature Grans (Abs): 0 10*3/uL (ref 0.0–0.1)
Immature Granulocytes: 0 %
Lymphocytes Absolute: 1.4 10*3/uL (ref 0.7–3.1)
Lymphs: 28 %
MCH: 28.8 pg (ref 26.6–33.0)
MCHC: 35.3 g/dL (ref 31.5–35.7)
MCV: 82 fL (ref 79–97)
Monocytes Absolute: 0.5 10*3/uL (ref 0.1–0.9)
Monocytes: 10 %
Neutrophils Absolute: 3 10*3/uL (ref 1.4–7.0)
Neutrophils: 59 %
Platelets: 244 10*3/uL (ref 150–450)
RBC: 5.63 x10E6/uL (ref 4.14–5.80)
RDW: 12 % (ref 11.6–15.4)
WBC: 5 10*3/uL (ref 3.4–10.8)

## 2020-08-31 LAB — CMP14+EGFR
ALT: 24 IU/L (ref 0–44)
AST: 16 IU/L (ref 0–40)
Albumin/Globulin Ratio: 2.1 (ref 1.2–2.2)
Albumin: 4.7 g/dL (ref 4.0–5.0)
Alkaline Phosphatase: 84 IU/L (ref 44–121)
BUN/Creatinine Ratio: 14 (ref 9–20)
BUN: 14 mg/dL (ref 6–20)
Bilirubin Total: 0.5 mg/dL (ref 0.0–1.2)
CO2: 26 mmol/L (ref 20–29)
Calcium: 9.5 mg/dL (ref 8.7–10.2)
Chloride: 101 mmol/L (ref 96–106)
Creatinine, Ser: 1.01 mg/dL (ref 0.76–1.27)
Globulin, Total: 2.2 g/dL (ref 1.5–4.5)
Glucose: 88 mg/dL (ref 65–99)
Potassium: 4.9 mmol/L (ref 3.5–5.2)
Sodium: 140 mmol/L (ref 134–144)
Total Protein: 6.9 g/dL (ref 6.0–8.5)
eGFR: 97 mL/min/{1.73_m2} (ref >59)

## 2020-08-31 MED ORDER — HYDROXYZINE HCL 25 MG PO TABS: 25.0000 mg | ORAL_TABLET | Freq: Three times a day (TID) | ORAL | 4 refills | Status: AC | PRN

## 2020-08-31 MED ORDER — AMLODIPINE BESYLATE 5 MG PO TABS: 5.0000 mg | ORAL_TABLET | Freq: Every day | ORAL | 4 refills | Status: DC

## 2020-08-31 NOTE — Progress Notes (Signed)
Subjective:    Patient ID: Jeremy Dunn, male    DOB: 1980-04-10, 40 y.o.   MRN: [id number]  Chief Complaint  Patient presents with   Medical Management of Chronic Issues   Pt presents to the office today for chronic follow up. States he has been sober from alcohol 5 years.  Hypertension This is a chronic problem. The current episode started more than 1 year ago. The problem has been resolved since onset. The problem is controlled. Associated symptoms include anxiety. Pertinent negatives include no malaise/fatigue, peripheral edema or shortness of breath. Risk factors for coronary artery disease include obesity and male gender. The current treatment provides moderate improvement.  Anxiety Presents for follow-up visit. Symptoms include nervous/anxious behavior. Patient reports no depressed mood, excessive worry, irritability or shortness of breath. Symptoms occur occasionally. The severity of symptoms is mild.       Review of Systems  Constitutional:  Negative for irritability and malaise/fatigue.  Respiratory:  Negative for shortness of breath.   Psychiatric/Behavioral:  The patient is nervous/anxious.   All other systems reviewed and are negative.     Objective:   Physical Exam Vitals reviewed.  Constitutional:      General: He is not in acute distress.    Appearance: He is well-developed.  HENT:     Head: Normocephalic.     Right Ear: Tympanic membrane normal.     Left Ear: Tympanic membrane normal.  Eyes:     General:        Right eye: No discharge.        Left eye: No discharge.     Pupils: Pupils are equal, round, and reactive to light.  Neck:     Thyroid: No thyromegaly.  Cardiovascular:     Rate and Rhythm: Normal rate and regular rhythm.     Heart sounds: Normal heart sounds. No murmur heard. Pulmonary:     Effort: Pulmonary effort is normal. No respiratory distress.     Breath sounds: Normal breath sounds. No wheezing.  Abdominal:     General: Bowel  sounds are normal. There is no distension.     Palpations: Abdomen is soft.     Tenderness: There is no abdominal tenderness.  Musculoskeletal:        General: No tenderness. Normal range of motion.     Cervical back: Normal range of motion and neck supple.  Skin:    General: Skin is warm and dry.     Findings: No erythema or rash.  Neurological:     Mental Status: He is alert and oriented to person, place, and time.     Cranial Nerves: No cranial nerve deficit.     Deep Tendon Reflexes: Reflexes are normal and symmetric.  Psychiatric:        Behavior: Behavior normal.        Thought Content: Thought content normal.        Judgment: Judgment normal.      BP 118/85   Pulse 74   Temp (!) 97.5 F (36.4 C) (Temporal)   Ht '5\' 5"'  (1.651 m)   Wt 164 lb 12.8 oz (74.8 kg)   BMI 27.42 kg/m      Assessment & Plan:  Jeremy Dunn comes in today with chief complaint of Medical Management of Chronic Issues   Diagnosis and orders addressed:  1. Anxiety - hydrOXYzine (ATARAX/VISTARIL) 25 MG tablet; Take 1 tablet (25 mg total) by mouth 3 (three) times daily as needed for  anxiety.  Dispense: 90 tablet; Refill: 4 - CMP14+EGFR - CBC with Differential/Platelet  2. Benign essential hypertension - amLODipine (NORVASC) 5 MG tablet; Take 1 tablet (5 mg total) by mouth daily.  Dispense: 90 tablet; Refill: 4 - CMP14+EGFR - CBC with Differential/Platelet  3. GAD (generalized anxiety disorder) - CMP14+EGFR - CBC with Differential/Platelet  4. Alcohol abuse, in remission - CMP14+EGFR - CBC with Differential/Platelet   Labs pending Health Maintenance reviewed Diet and exercise encouraged  Follow up plan: 1 year    [name], FNP

## 2020-08-31 NOTE — Patient Instructions (Signed)

## 2021-03-25 DIAGNOSIS — S63601A Unspecified sprain of right thumb, initial encounter: Secondary | ICD-10-CM | POA: Diagnosis not present

## 2021-03-25 DIAGNOSIS — W231XXA Caught, crushed, jammed, or pinched between stationary objects, initial encounter: Secondary | ICD-10-CM | POA: Diagnosis not present

## 2021-03-25 DIAGNOSIS — Z882 Allergy status to sulfonamides status: Secondary | ICD-10-CM | POA: Diagnosis not present

## 2021-03-25 DIAGNOSIS — I10 Essential (primary) hypertension: Secondary | ICD-10-CM | POA: Diagnosis not present

## 2021-03-25 DIAGNOSIS — S60221A Contusion of right hand, initial encounter: Secondary | ICD-10-CM | POA: Diagnosis not present

## 2021-03-25 DIAGNOSIS — Z87891 Personal history of nicotine dependence: Secondary | ICD-10-CM | POA: Diagnosis not present

## 2021-03-25 DIAGNOSIS — S6991XA Unspecified injury of right wrist, hand and finger(s), initial encounter: Secondary | ICD-10-CM | POA: Diagnosis not present

## 2021-03-25 NOTE — Telephone Encounter (Signed)
error 

## 2021-04-05 DIAGNOSIS — I10 Essential (primary) hypertension: Secondary | ICD-10-CM | POA: Diagnosis not present

## 2021-04-05 DIAGNOSIS — K5939 Other megacolon: Secondary | ICD-10-CM | POA: Diagnosis not present

## 2021-04-05 DIAGNOSIS — R109 Unspecified abdominal pain: Secondary | ICD-10-CM | POA: Diagnosis not present

## 2021-04-05 DIAGNOSIS — K529 Noninfective gastroenteritis and colitis, unspecified: Secondary | ICD-10-CM | POA: Diagnosis not present

## 2021-04-05 DIAGNOSIS — Z882 Allergy status to sulfonamides status: Secondary | ICD-10-CM | POA: Diagnosis not present

## 2021-04-05 DIAGNOSIS — Z87891 Personal history of nicotine dependence: Secondary | ICD-10-CM | POA: Diagnosis not present

## 2021-04-05 DIAGNOSIS — R1013 Epigastric pain: Secondary | ICD-10-CM | POA: Diagnosis not present

## 2021-04-05 DIAGNOSIS — R197 Diarrhea, unspecified: Secondary | ICD-10-CM | POA: Diagnosis not present

## 2021-04-05 DIAGNOSIS — K6389 Other specified diseases of intestine: Secondary | ICD-10-CM | POA: Diagnosis not present

## 2021-04-07 ENCOUNTER — Telehealth: Payer: Self-pay

## 2021-04-07 NOTE — Telephone Encounter (Signed)
Transition Care Management Unsuccessful Follow-up Telephone Call ? ?Date of discharge and from where:  04/06/2021 from [facility]. ? ?Attempts:  1st Attempt ? ?Reason for unsuccessful TCM follow-up call:  Unable to leave message ? ? ? ?

## 2021-04-08 NOTE — Telephone Encounter (Signed)
Transition Care Management Unsuccessful Follow-up Telephone Call ? ?Date of discharge and from where:  04/06/2021 from [facility]. ? ?Attempts:  2nd Attempt ? ?Reason for unsuccessful TCM follow-up call:  Unable to leave message ? ? ? ?

## 2021-04-12 NOTE — Telephone Encounter (Signed)
Transition Care Management Unsuccessful Follow-up Telephone Call ? ?Date of discharge and from where:  04/06/2021-[facility]  ? ?Attempts:  3rd Attempt ? ?Reason for unsuccessful TCM follow-up call:  Unable to leave message ? ?  ?

## 2021-07-13 DIAGNOSIS — M722 Plantar fascial fibromatosis: Secondary | ICD-10-CM | POA: Diagnosis not present

## 2021-07-13 DIAGNOSIS — M79671 Pain in right foot: Secondary | ICD-10-CM | POA: Diagnosis not present

## 2021-07-13 DIAGNOSIS — Z882 Allergy status to sulfonamides status: Secondary | ICD-10-CM | POA: Diagnosis not present

## 2021-07-13 DIAGNOSIS — Z87891 Personal history of nicotine dependence: Secondary | ICD-10-CM | POA: Diagnosis not present

## 2021-07-13 DIAGNOSIS — I10 Essential (primary) hypertension: Secondary | ICD-10-CM | POA: Diagnosis not present

## 2021-09-01 ENCOUNTER — Ambulatory Visit: Payer: Medicaid Other | Admitting: Family

## 2021-09-06 ENCOUNTER — Encounter: Payer: Self-pay | Admitting: Family

## 2021-10-09 DIAGNOSIS — X58XXXA Exposure to other specified factors, initial encounter: Secondary | ICD-10-CM | POA: Diagnosis not present

## 2021-10-09 DIAGNOSIS — Z882 Allergy status to sulfonamides status: Secondary | ICD-10-CM | POA: Diagnosis not present

## 2021-10-09 DIAGNOSIS — W503XXA Accidental bite by another person, initial encounter: Secondary | ICD-10-CM | POA: Diagnosis not present

## 2021-10-09 DIAGNOSIS — I10 Essential (primary) hypertension: Secondary | ICD-10-CM | POA: Diagnosis not present

## 2021-10-09 DIAGNOSIS — S01552A Open bite of oral cavity, initial encounter: Secondary | ICD-10-CM | POA: Diagnosis not present

## 2021-10-09 DIAGNOSIS — Z87891 Personal history of nicotine dependence: Secondary | ICD-10-CM | POA: Diagnosis not present

## 2021-11-06 DIAGNOSIS — I10 Essential (primary) hypertension: Secondary | ICD-10-CM | POA: Diagnosis not present

## 2021-11-06 DIAGNOSIS — S0993XA Unspecified injury of face, initial encounter: Secondary | ICD-10-CM | POA: Diagnosis not present

## 2021-11-06 DIAGNOSIS — Z87891 Personal history of nicotine dependence: Secondary | ICD-10-CM | POA: Diagnosis not present

## 2021-11-06 DIAGNOSIS — R6884 Jaw pain: Secondary | ICD-10-CM | POA: Diagnosis not present

## 2021-11-06 DIAGNOSIS — W228XXA Striking against or struck by other objects, initial encounter: Secondary | ICD-10-CM | POA: Diagnosis not present

## 2021-11-07 DIAGNOSIS — K0381 Cracked tooth: Secondary | ICD-10-CM | POA: Diagnosis not present

## 2021-11-07 DIAGNOSIS — K0889 Other specified disorders of teeth and supporting structures: Secondary | ICD-10-CM | POA: Diagnosis not present

## 2021-12-22 ENCOUNTER — Ambulatory Visit: Payer: Medicaid Other | Admitting: Family

## 2021-12-22 ENCOUNTER — Encounter: Payer: Self-pay | Admitting: Family

## 2021-12-22 VITALS — BP 130/83 | HR 77 | Temp 97.3°F | Ht 65.0 in | Wt 171.0 lb

## 2021-12-22 DIAGNOSIS — M26609 Unspecified temporomandibular joint disorder, unspecified side: Secondary | ICD-10-CM

## 2021-12-22 MED ORDER — DICLOFENAC SODIUM 75 MG PO TBEC: 75.0000 mg | DELAYED_RELEASE_TABLET | Freq: Two times a day (BID) | ORAL | 0 refills | Status: AC

## 2021-12-22 MED ORDER — CYCLOBENZAPRINE HCL 10 MG PO TABS: 10.0000 mg | ORAL_TABLET | Freq: Three times a day (TID) | ORAL | 1 refills | Status: AC | PRN

## 2021-12-22 NOTE — Patient Instructions (Addendum)
Temporomandibular Joint Syndrome  Temporomandibular joint syndrome (TMJ syndrome) is a condition that causes pain in the temporomandibular joints. These joints are located near your ears and allow your jaw to open and close. For people with TMJ syndrome, chewing, biting, or other movements of the jaw can be difficult or painful. TMJ syndrome is often mild and goes away within a few weeks. However, sometimes the condition becomes a long-term (chronic) problem. What are the causes? This condition may be caused by: Grinding your teeth or clenching your jaw. Some people do this when they are stressed. Arthritis. An injury to the jaw. A head or neck injury. Teeth or dentures that are not aligned well. In some cases, the cause of TMJ syndrome may not be known. What are the signs or symptoms? The most common symptom of this condition is aching pain on the side of the head in the area of the TMJ. Other symptoms may include: Pain when moving your jaw, such as when chewing or biting. Not being able to open your jaw all the way. Making a clicking sound when you open your mouth. Headache. Earache. Neck or shoulder pain. How is this diagnosed? This condition may be diagnosed based on: Your symptoms and medical history. A physical exam. Your health care provider may check the range of motion of your jaw. Imaging tests, such as X-rays or an MRI. You may also need to see your dentist, who will check if your teeth and jaw are lined up correctly. How is this treated? TMJ syndrome often goes away on its own. If treatment is needed, it may include: Eating soft foods and applying ice or heat. Medicines to relieve pain or inflammation. Medicines or massage to relax the muscles. A splint, bite plate, or mouthpiece to prevent teeth grinding or jaw clenching. Relaxation techniques or counseling to help reduce stress. A therapy for pain in which an electrical current is applied to the nerves through the skin  (transcutaneous electrical nerve stimulation). Acupuncture. This may help to relieve pain. Jaw surgery. This is rarely needed. Follow these instructions at home:  Eating and drinking Eat a soft diet if you are having trouble chewing. Avoid foods that require a lot of chewing. Do not chew gum. General instructions Take over-the-counter and prescription medicines only as told by your health care provider. If directed, put ice on the painful area. To do this: Put ice in a plastic bag. Place a towel between your skin and the bag. Leave the ice on for 20 minutes, 2-3 times a day. Remove the ice if your skin turns bright red. This is very important. If you cannot feel pain, heat, or cold, you have a greater risk of damage to the area. Apply a warm, wet cloth (warm compress) to the painful area as told. Massage your jaw area and do any jaw stretching exercises as told by your health care provider. If you were given a splint, bite plate, or mouthpiece, wear it as told by your health care provider. Keep all follow-up visits. This is important. Where to find more information [place]: [email or web address]-[email or web address]/ Contact a health care provider if: You have trouble eating. You have new or worsening symptoms. Get help right away if: Your jaw locks. Summary Temporomandibular joint syndrome (TMJ syndrome) is a condition that causes pain in the temporomandibular joints. These joints are located near your ears and allow your jaw to open and close. TMJ syndrome is often mild and goes away within  a few weeks. However, sometimes the condition becomes a long-term (chronic) problem. Symptoms include an aching pain on the side of the head in the area of the TMJ, pain when chewing or biting, and being unable to open your jaw all the way. You may also make a clicking sound when you open your mouth. TMJ syndrome often goes away on its own. If treatment is needed, it may  include medicines to relieve pain, reduce inflammation, or relax the muscles. A splint, bite plate, or mouthpiece may also be used to prevent teeth grinding or jaw clenching. This information is not intended to replace advice given to you by your health care provider. Make sure you discuss any questions you have with your health care provider.  Jaw Range of Motion Exercises Jaw range of motion exercises are exercises that help your jaw move better. Exercises that help you have good posture (postural exercises) also help relieve jaw discomfort. These are often done along with range of motion exercises. These exercises can help prevent or improve: Trouble opening your mouth. Pain in your jaw while it is open or closed. Temporomandibular joint (TMJ) pain. Headache caused by jaw tension. Take other actions to prevent or relieve jaw pain, such as: Avoiding things that cause or increase jaw pain. This may include: Chewing gum or eating hard foods. Clenching your jaw or teeth, grinding your teeth, or keeping tension in your jaw muscles. Opening your mouth wide, such as for a big yawn. Leaning on your jaw, such as resting your jaw in your hand while leaning on a desk. Putting ice on your jaw. To do this: Put ice in a plastic bag. Place a towel between your skin and the bag. Leave the ice on for 20 minutes, 2-3 times a day. Remove the ice if your skin turns bright red. This is very important. If you cannot feel pain, heat, or cold, you have a greater risk of damage to the area. Only do jaw exercises that your health care provider recommends. Only move your jaw as far as it can comfortably go in each direction. Do not move your jaw into positions that cause pain. Range of motion exercises Repeat each of these exercises 8 times, 1-2 times a day, or as told by your health care provider. Exercise A: Forward protrusion  Push your jaw forward. Hold this position for 1-2 seconds. Let your jaw return to its  normal position and rest it there for 1-2 seconds. Exercise B: Controlled opening  Stand or sit in front of a mirror. Place your tongue on the roof of your mouth, just behind your top teeth. Keeping your tongue on the roof of your mouth, slowly open and close your mouth. While you open and close your mouth, watch your jaw in the mirror. Try to keep your jaw from moving to one side or the other. Exercise C: Right and left motion  Move your jaw right. Hold this position for 1-2 seconds. Let your jaw return to its normal position, and rest it there for 1-2 seconds. Move your jaw left. Hold this position for 1-2 seconds. Let your jaw return to its normal position, and rest it there for 1-2 seconds. Postural exercises Exercise A: Chin tucks  You can do this exercise sitting, standing, or lying down. Move your head straight back, keeping your head level. You can guide the movement by placing your fingers on your chin to push your jaw back in an even motion. You should be able  to feel a double chin form at the end of the motion. Hold this position for 5 seconds. Repeat 10-15 times. Exercise B: Shoulder blade squeeze  Sit or stand. Bend your elbows to about 90 degrees, which is the shape of a capital letter "L." Keep your upper arms by your body. Squeeze your shoulder blades down and back, as though you were trying to touch your elbows behind you. Do not shrug your shoulders or move your head. Hold this position for 5 seconds. Repeat 10-15 times. Exercise C: Chest stretch  Stand in a doorway with one of your feet slightly in front of the other. This is called a staggered stance. If you cannot reach your forearms to the door frame, do this exercise in a corner of a room. Put both of your hands and your forearms on the door frame, with your arms wide apart. Make sure your arms are at a 90-degree angle to your body. Place your hands on the door frame at the height of your elbows. Slowly move your  weight onto your front foot until you feel a stretch across your chest and in the front of your shoulders. Keep your head and chest upright and keep your abdominal muscles tight. Do not lean in. Hold this position for 30 seconds. Repeat 3 times. Contact a health care provider if: You have jaw pain that is new or gets worse. You have clicking or popping sounds while doing the exercises. Get help right away if: Your jaw is stuck in one place and you cannot move it. You cannot open or close your mouth. Summary Jaw range of motion exercises are exercises that help your jaw move better. Take actions to prevent or relieve jaw pain: limit chewing gum or eating hard foods; clenching your jaw or teeth; or leaning on your jaw, such as resting your jaw in your hand while leaning on a desk. Repeat each of the jaw range of motion exercises 8 times, 1-2 times a day, or as told by your health care provider. Contact a health care provider if you have clicking or popping sounds while doing the exercises. This information is not intended to replace advice given to you by your health care provider. Make sure you discuss any questions you have with your health care provider. Document Revised: 08/01/2020 Document Reviewed: 08/01/2020 Elsevier Patient Education  2023 Elsevier Inc.  Document Revised: 08/01/2020 Document Reviewed: 08/01/2020 Elsevier Patient Education  2023 [employer].

## 2021-12-22 NOTE — Progress Notes (Signed)
Subjective:    Patient ID: Jeremy Dunn, male    DOB: Jan 12, 1980, 41 y.o.   MRN: [id number]  Chief Complaint  Patient presents with   Jaw Pain    Left side locked up for over a month    PT presents to the office today with left jaw pain that started a month after he hit his face with a drill. He went to the ED on 11/06/21 and had a negative CT maxillofacial CT. However, reports aching pain in left jaw that radiated to his ear 6 out 10, but if he bites down on food it is a 10 out 10. He has been eating soft diet and lots of soup.   He denies any abscess, fevers and reports he had all of back teeth removed years ago.  HPI    Review of Systems  All other systems reviewed and are negative.      Objective:   Physical Exam Vitals reviewed.  Constitutional:      General: He is not in acute distress.    Appearance: He is well-developed.  HENT:     Head: Normocephalic.     Right Ear: Tympanic membrane normal.     Left Ear: Tympanic membrane normal.  Eyes:     General:        Right eye: No discharge.        Left eye: No discharge.     Pupils: Pupils are equal, round, and reactive to light.  Neck:     Thyroid: No thyromegaly.  Cardiovascular:     Rate and Rhythm: Normal rate and regular rhythm.     Heart sounds: Normal heart sounds. No murmur heard. Pulmonary:     Effort: Pulmonary effort is normal. No respiratory distress.     Breath sounds: Normal breath sounds. No wheezing.  Abdominal:     General: Bowel sounds are normal. There is no distension.     Palpations: Abdomen is soft.     Tenderness: There is no abdominal tenderness.  Musculoskeletal:        General: Tenderness present.     Cervical back: Normal range of motion and neck supple.     Comments: Pain in left jaw with opening and closing   Skin:    General: Skin is warm and dry.     Findings: No erythema or rash.  Neurological:     Mental Status: He is alert and oriented to person, place, and time.      Cranial Nerves: No cranial nerve deficit.     Deep Tendon Reflexes: Reflexes are normal and symmetric.  Psychiatric:        Behavior: Behavior normal.        Thought Content: Thought content normal.        Judgment: Judgment normal.     BP 130/83   Pulse 77   Temp (!) 97.3 F (36.3 C) (Temporal)   Ht 5\' 5"  (1.651 m)   Wt 171 lb (77.6 kg)   SpO2 97%   BMI 28.46 kg/m        Assessment & Plan:  Jeremy Dunn comes in today with chief complaint of Jaw Pain (Left side locked up for over a month )   Diagnosis and orders addressed:  1. TMJ (temporomandibular joint disorder) Start diclofenac BID with food, no other NSAIDs Start Flexeril  Sedation precautions  Follow up if symptoms worsen or do not improve  - diclofenac (VOLTAREN) 75 MG EC tablet; Take  1 tablet (75 mg total) by mouth 2 (two) times daily.  Dispense: 30 tablet; Refill: 0 - cyclobenzaprine (FLEXERIL) 10 MG tablet; Take 1 tablet (10 mg total) by mouth 3 (three) times daily as needed for muscle spasms.  Dispense: 90 tablet; Refill: 1   Jannifer Rodney, FNP

## 2021-12-23 ENCOUNTER — Telehealth: Payer: Self-pay

## 2021-12-23 ENCOUNTER — Other Ambulatory Visit (HOSPITAL_COMMUNITY): Payer: Self-pay

## 2021-12-23 NOTE — Telephone Encounter (Signed)
Received notification from [facility] Medicaid regarding a prior authorization for Diclofenac 75mg  Dr tabs. Authorization has been APPROVED from 12/23/21 to 12/23/22.   Per test claim, copay for 15 days supply is $4.00   Authorization # PA Case ID: 12/25/22 Key: [id number]  Placed a call to [facility] pharmacy to notify of the approval.

## 2022-01-04 ENCOUNTER — Other Ambulatory Visit: Payer: Self-pay | Admitting: Family

## 2022-01-04 DIAGNOSIS — I10 Essential (primary) hypertension: Secondary | ICD-10-CM

## 2022-04-27 ENCOUNTER — Other Ambulatory Visit: Payer: Self-pay | Admitting: Family

## 2022-04-27 DIAGNOSIS — I10 Essential (primary) hypertension: Secondary | ICD-10-CM

## 2022-09-03 DIAGNOSIS — W06XXXA Fall from bed, initial encounter: Secondary | ICD-10-CM | POA: Diagnosis not present

## 2022-09-03 DIAGNOSIS — M545 Low back pain, unspecified: Secondary | ICD-10-CM | POA: Diagnosis not present

## 2022-09-03 DIAGNOSIS — R109 Unspecified abdominal pain: Secondary | ICD-10-CM | POA: Diagnosis not present

## 2022-09-03 DIAGNOSIS — Z87891 Personal history of nicotine dependence: Secondary | ICD-10-CM | POA: Diagnosis not present

## 2022-09-03 DIAGNOSIS — I700 Atherosclerosis of aorta: Secondary | ICD-10-CM | POA: Diagnosis not present

## 2022-09-03 DIAGNOSIS — I10 Essential (primary) hypertension: Secondary | ICD-10-CM | POA: Diagnosis not present

## 2022-12-13 ENCOUNTER — Telehealth: Payer: Self-pay | Admitting: *Deleted

## 2022-12-13 NOTE — Telephone Encounter (Signed)
  Pt aware medication can't be filled till he is seen in office since it has been 1 year since last visit and he hasn't had any labs in well over a year. Pt will keep appt 01/2023.    Copied from CRM [id number]. Topic: Clinical - Medication Question >> Dec 13, 2022  2:03 PM Deaijah H wrote: Reason for CRM: Patient called in stating he doesn't have a ride to make appointments but would like to know if Dr. Lendon Colonel would refill blood pressure medication due to be out and will schedule appointment for Jan / please call [phone]

## 2023-01-04 ENCOUNTER — Ambulatory Visit: Payer: Medicaid Other | Admitting: Family

## 2023-01-23 ENCOUNTER — Ambulatory Visit: Payer: Medicaid Other | Admitting: Family

## 2023-01-25 ENCOUNTER — Encounter: Payer: Self-pay | Admitting: Family

## 2023-01-27 DIAGNOSIS — R1084 Generalized abdominal pain: Secondary | ICD-10-CM | POA: Diagnosis not present

## 2023-01-27 DIAGNOSIS — I700 Atherosclerosis of aorta: Secondary | ICD-10-CM | POA: Diagnosis not present

## 2023-01-27 DIAGNOSIS — M47816 Spondylosis without myelopathy or radiculopathy, lumbar region: Secondary | ICD-10-CM | POA: Diagnosis not present

## 2023-01-27 DIAGNOSIS — R1032 Left lower quadrant pain: Secondary | ICD-10-CM | POA: Diagnosis not present

## 2023-01-27 DIAGNOSIS — F17290 Nicotine dependence, other tobacco product, uncomplicated: Secondary | ICD-10-CM | POA: Diagnosis not present

## 2023-01-27 DIAGNOSIS — R109 Unspecified abdominal pain: Secondary | ICD-10-CM | POA: Diagnosis not present

## 2023-01-27 DIAGNOSIS — N3289 Other specified disorders of bladder: Secondary | ICD-10-CM | POA: Diagnosis not present

## 2023-01-27 DIAGNOSIS — K529 Noninfective gastroenteritis and colitis, unspecified: Secondary | ICD-10-CM | POA: Diagnosis not present

## 2023-09-11 DIAGNOSIS — S6991XA Unspecified injury of right wrist, hand and finger(s), initial encounter: Secondary | ICD-10-CM | POA: Diagnosis not present

## 2023-09-11 DIAGNOSIS — M25531 Pain in right wrist: Secondary | ICD-10-CM | POA: Diagnosis not present

## 2023-09-11 DIAGNOSIS — Z87891 Personal history of nicotine dependence: Secondary | ICD-10-CM | POA: Diagnosis not present

## 2023-09-11 DIAGNOSIS — I10 Essential (primary) hypertension: Secondary | ICD-10-CM | POA: Diagnosis not present

## 2023-09-11 DIAGNOSIS — W1839XA Other fall on same level, initial encounter: Secondary | ICD-10-CM | POA: Diagnosis not present

## 2023-11-02 DIAGNOSIS — W010XXA Fall on same level from slipping, tripping and stumbling without subsequent striking against object, initial encounter: Secondary | ICD-10-CM | POA: Diagnosis not present

## 2023-11-02 DIAGNOSIS — Z882 Allergy status to sulfonamides status: Secondary | ICD-10-CM | POA: Diagnosis not present

## 2023-11-02 DIAGNOSIS — S60222A Contusion of left hand, initial encounter: Secondary | ICD-10-CM | POA: Diagnosis not present

## 2023-11-02 DIAGNOSIS — I10 Essential (primary) hypertension: Secondary | ICD-10-CM | POA: Diagnosis not present

## 2023-11-02 DIAGNOSIS — S6992XA Unspecified injury of left wrist, hand and finger(s), initial encounter: Secondary | ICD-10-CM | POA: Diagnosis not present

## 2023-11-02 DIAGNOSIS — S8992XA Unspecified injury of left lower leg, initial encounter: Secondary | ICD-10-CM | POA: Diagnosis not present

## 2023-11-02 DIAGNOSIS — S59902A Unspecified injury of left elbow, initial encounter: Secondary | ICD-10-CM | POA: Diagnosis not present

## 2023-11-02 DIAGNOSIS — S5002XA Contusion of left elbow, initial encounter: Secondary | ICD-10-CM | POA: Diagnosis not present

## 2023-11-02 DIAGNOSIS — S8002XA Contusion of left knee, initial encounter: Secondary | ICD-10-CM | POA: Diagnosis not present

## 2023-11-02 DIAGNOSIS — Z87891 Personal history of nicotine dependence: Secondary | ICD-10-CM | POA: Diagnosis not present
